# Patient Record
Sex: Male | Born: 1955 | Race: White | Hispanic: No | Marital: Married | Smoking: Former smoker
Health system: Southern US, Community
[De-identification: ages and names within clinical notes are randomized; demographics above are authoritative.]

## PROBLEM LIST (undated history)

## (undated) DIAGNOSIS — I639 Cerebral infarction, unspecified: Secondary | ICD-10-CM

## (undated) DIAGNOSIS — I499 Cardiac arrhythmia, unspecified: Secondary | ICD-10-CM

## (undated) DIAGNOSIS — I252 Old myocardial infarction: Secondary | ICD-10-CM

## (undated) DIAGNOSIS — I10 Essential (primary) hypertension: Secondary | ICD-10-CM

## (undated) DIAGNOSIS — E785 Hyperlipidemia, unspecified: Secondary | ICD-10-CM

## (undated) DIAGNOSIS — I2510 Atherosclerotic heart disease of native coronary artery without angina pectoris: Secondary | ICD-10-CM

## (undated) DIAGNOSIS — Z950 Presence of cardiac pacemaker: Secondary | ICD-10-CM

## (undated) DIAGNOSIS — I495 Sick sinus syndrome: Secondary | ICD-10-CM

## (undated) DIAGNOSIS — K921 Melena: Secondary | ICD-10-CM

## (undated) HISTORY — DX: Sick sinus syndrome: I49.5

## (undated) HISTORY — PX: INSERT / REPLACE / REMOVE PACEMAKER: SUR710

## (undated) HISTORY — DX: Cardiac arrhythmia, unspecified: I49.9

## (undated) HISTORY — DX: Essential (primary) hypertension: I10

## (undated) HISTORY — DX: Atherosclerotic heart disease of native coronary artery without angina pectoris: I25.10

## (undated) HISTORY — DX: Presence of cardiac pacemaker: Z95.0

## (undated) HISTORY — DX: Melena: K92.1

## (undated) HISTORY — DX: Old myocardial infarction: I25.2

## (undated) HISTORY — PX: CARDIAC DEFIBRILLATOR PLACEMENT: SHX171

## (undated) HISTORY — DX: Cerebral infarction, unspecified: I63.9

## (undated) HISTORY — DX: Hyperlipidemia, unspecified: E78.5

---

## 2002-02-05 DIAGNOSIS — I639 Cerebral infarction, unspecified: Secondary | ICD-10-CM

## 2002-02-05 HISTORY — DX: Cerebral infarction, unspecified: I63.9

## 2004-01-10 ENCOUNTER — Ambulatory Visit: Payer: Self-pay | Admitting: Internal Medicine

## 2004-04-18 ENCOUNTER — Ambulatory Visit: Payer: Self-pay | Admitting: Surgery

## 2013-02-05 HISTORY — PX: CARDIAC CATHETERIZATION: SHX172

## 2013-07-22 DIAGNOSIS — I252 Old myocardial infarction: Secondary | ICD-10-CM

## 2013-07-22 DIAGNOSIS — I2510 Atherosclerotic heart disease of native coronary artery without angina pectoris: Secondary | ICD-10-CM

## 2013-07-22 HISTORY — DX: Atherosclerotic heart disease of native coronary artery without angina pectoris: I25.10

## 2013-07-22 HISTORY — DX: Old myocardial infarction: I25.2

## 2013-08-18 ENCOUNTER — Encounter: Payer: Self-pay | Admitting: Cardiovascular Disease

## 2013-09-05 ENCOUNTER — Encounter: Payer: Self-pay | Admitting: Cardiovascular Disease

## 2013-09-05 DIAGNOSIS — I495 Sick sinus syndrome: Secondary | ICD-10-CM

## 2013-09-05 HISTORY — DX: Sick sinus syndrome: I49.5

## 2013-09-10 DIAGNOSIS — Z950 Presence of cardiac pacemaker: Secondary | ICD-10-CM

## 2013-09-10 HISTORY — DX: Presence of cardiac pacemaker: Z95.0

## 2013-10-06 ENCOUNTER — Encounter: Payer: Self-pay | Admitting: Cardiovascular Disease

## 2013-11-05 ENCOUNTER — Encounter: Payer: Self-pay | Admitting: Cardiovascular Disease

## 2016-02-21 ENCOUNTER — Encounter: Payer: Self-pay | Admitting: Internal Medicine

## 2016-02-21 ENCOUNTER — Ambulatory Visit (INDEPENDENT_AMBULATORY_CARE_PROVIDER_SITE_OTHER): Payer: BC Managed Care – PPO | Admitting: Internal Medicine

## 2016-02-21 VITALS — BP 134/88 | HR 70 | Ht 71.0 in | Wt 221.5 lb

## 2016-02-21 DIAGNOSIS — I4891 Unspecified atrial fibrillation: Secondary | ICD-10-CM | POA: Diagnosis not present

## 2016-02-21 DIAGNOSIS — E782 Mixed hyperlipidemia: Secondary | ICD-10-CM

## 2016-02-21 DIAGNOSIS — Z950 Presence of cardiac pacemaker: Secondary | ICD-10-CM | POA: Diagnosis not present

## 2016-02-21 DIAGNOSIS — I495 Sick sinus syndrome: Secondary | ICD-10-CM

## 2016-02-21 NOTE — Patient Instructions (Addendum)
Medication Instructions: - none ordered  Labwork: - Your physician recommends that you have lab work today: CMET/ Lipid/ TSH/ CBC  Procedures/Testing: - Your physician has requested that you have an echocardiogram. Echocardiography is a painless test that uses sound waves to create images of your heart. It provides your doctor with information about the size and shape of your heart and how well your heart's chambers and valves are working. This procedure takes approximately one hour. There are no restrictions for this procedure.   Follow-Up: - Your physician recommends that you schedule a follow-up appointment: next available with Dr. Okey DupreEnd (to establish)  - Remote monitoring is used to monitor your Pacemaker of ICD from home. This monitoring reduces the number of office visits required to check your device to one time per year. It allows us to keep an eye on the functioning of your device to ensure it is working properly. You are scheduled for a device check from home on 05/22/16. You may send your transmission at any time that day. If you have a wireless device, the transmission will be sent automatically. After your physician reviews your transmission, you will receive a postcard with your next transmission date.  - Your physician wants you to follow-up in: 1 year with Dr. Graciela HusbandsKlein. You will receive a reminder letter in the mail two months in advance. If you don't receive a letter, please call our office to schedule the follow-up appointment.  Any Additional Special Instructions Will Be Listed Below (If Applicable). - call the Device Clinic at [phone] if you cannot find your monitor box     If you need a refill on your cardiac medications before your next appointment, please call your pharmacy.

## 2016-02-21 NOTE — Progress Notes (Signed)
ELECTROPHYSIOLOGY CONSULT NOTE  Patient ID: Eduardo Ray., MRN: [id number], DOB/AGE: [birth year] 61 y.o. Admit date: (Not on file) Date of Consult: 02/21/2016  Primary Physician: No primary care provider on file. Primary Cardiologist: NEW Consulting Physician SELF  Chief Complaint: PACEMAKER TO ESTABLISH   HPI Eduardo Ray. is a 61 y.o. male seen to establish pacemaker follow-up. This was implanted for sinus node dysfunction with syncope that occurred within weeks following IMI 2015  No further syncope.  Some DOE but no chest pain, orthopnea or edema  [place] palpitations  History of coronary artery disease with STEMI 6/15 treated with stents . DATE TEST    6/17    echo   EF 50-55 % Inferior WMA  6/17    cath  RCA 100%  Aspiration/ DES          LVEF [facility] was 50-55% with inferoseptal and inferobasilar hypokinesis He is on DAPT   He has a history of stroke about 2005.  He has significant hyperlipidemia 6/15 LDL 207--  No repeats    Past Medical History:  Diagnosis Date  . Black stool   . CAD (coronary artery disease)   . Hx of ST elevation myocardial infarction   . Hyperlipidemia   . Hypertension   . Irregular heart beat   . Pacemaker-Medtronic MRI compatible   . Sinus node dysfunction (HCC)   . Stroke [facility])       Surgical History:  Past Surgical History:  Procedure Laterality Date  . CARDIAC CATHETERIZATION  2015   X1 stent [facility]   . CARDIAC DEFIBRILLATOR PLACEMENT    . INSERT / REPLACE / REMOVE PACEMAKER     Medtronic     Home Meds: Prior to Admission medications   Medication Sig Start Date End Date Taking? Authorizing Provider  acetaminophen (TYLENOL) 650 MG CR tablet Take 650 mg by mouth every 8 (eight) hours as needed for pain.   Yes Historical Provider, MD  aspirin EC 81 MG tablet Take 81 mg by mouth daily.    Yes Historical Provider, MD    Allergies:  Allergies  Allergen Reactions  . Atorvastatin     Muscle pain  . Rosuvastatin     Other  reaction(s): Other (See Comments) Muscle pains    Social History   Social History  . Marital status: Married    Spouse name: N/A  . Number of children: N/A  . Years of education: N/A   Occupational History  . Not on file.   Social History Main Topics  . Smoking status: Never Smoker  . Smokeless tobacco: Never Used  . Alcohol use Yes     Comment: social  . Drug use:     Types: Marijuana  . Sexual activity: Not on file   Other Topics Concern  . Not on file   Social History Narrative  . No narrative on file     Family History  Problem Relation Age of Onset  . Hyperlipidemia Mother   . Hypertension Mother      ROS:  Please see the history of present illness.     All other systems reviewed and negative.    Physical Exam: Blood pressure 134/88, pulse 70, height 5\' 11"  (1.803 m), weight 221 lb 8 oz (100.5 kg). General: Well developed, well nourished male in no acute distress. Head: Normocephalic, atraumatic, sclera non-icteric, no xanthomas, nares are without discharge. EENT: normal  Lymph Nodes:  none Neck: Negative for carotid  bruits. JVD not elevated. Back:without scoliosis kyphosis Lungs: Clear bilaterally to auscultation without wheezes, rales, or rhonchi. Breathing is unlabored. Heart: RRR with S1 S2. No  murmur . No rubs, or gallops appreciated. Abdomen: Soft, non-tender, non-distended with normoactive bowel sounds. No hepatomegaly. No rebound/guarding. No obvious abdominal masses. Msk:  Strength and tone appear normal for age. Extremities: No clubbing or cyanosis. No edema.  Distal pedal pulses are 2+ and equal bilaterally. Skin: Warm and Dry Neuro: Alert and oriented X 3. CN III-XII intact Grossly normal sensory and motor function . Psych:  Responds to questions appropriately with a normal affect.      Labs: Cardiac Enzymes No results for input(s): CKTOTAL, CKMB, TROPONINI in the last 72 hours. CBC No results found for: WBC, HGB, HCT, MCV,  PLT PROTIME: No results for input(s): LABPROT, INR in the last 72 hours. Chemistry No results for input(s): NA, K, CL, CO2, BUN, CREATININE, CALCIUM, PROT, BILITOT, ALKPHOS, ALT, AST, GLUCOSE in the last 168 hours.  Invalid input(s): LABALBU Lipids No results found for: CHOL, HDL, LDLCALC, TRIG BNP No results found for: PROBNP Thyroid Function Tests: No results for input(s): TSH, T4TOTAL, T3FREE, THYROIDAB in the last 72 hours.  Invalid input(s): FREET3 Miscellaneous No results found for: DDIMER  Radiology/Studies:  No results found.  EKG: Atrial paced rhythm at 70 Intervals 20/08/42 Axis L   Assessment and Plan:   Syncope  Sinus node dysfunction  Pacemaker-Medtronic  Hyperlipidemia   statin intolerance  Coronary artery disease with prior stenting/prior MI   The patient has sinus node dysfunction with appropriate pacemaker function and no interval syncope  He has coronary artery disease without symptoms. He has severe dyslipidemia with LDL greater than 200 and has been statin intolerant. We will refer him for PCKS9  therapy   For now we'll continue aspirin.   We will check an echocardiogram to see about LV function      [name]

## 2016-02-22 LAB — COMPREHENSIVE METABOLIC PANEL
ALT: 12 IU/L (ref 0–44)
AST: 12 IU/L (ref 0–40)
Albumin/Globulin Ratio: 1.7 (ref 1.2–2.2)
Albumin: 4.4 g/dL (ref 3.6–4.8)
Alkaline Phosphatase: 74 IU/L (ref 39–117)
BUN/Creatinine Ratio: 10 (ref 10–24)
BUN: 11 mg/dL (ref 8–27)
Bilirubin Total: 0.3 mg/dL (ref 0.0–1.2)
CALCIUM: 9.5 mg/dL (ref 8.6–10.2)
CO2: 21 mmol/L (ref 18–29)
CREATININE: 1.08 mg/dL (ref 0.76–1.27)
Chloride: 101 mmol/L (ref 96–106)
GFR calc Af Amer: 85 mL/min/{1.73_m2} (ref >59)
GFR, EST NON AFRICAN AMERICAN: 73 mL/min/{1.73_m2} (ref >59)
GLOBULIN, TOTAL: 2.6 g/dL (ref 1.5–4.5)
GLUCOSE: 98 mg/dL (ref 65–99)
Potassium: 4.6 mmol/L (ref 3.5–5.2)
SODIUM: 139 mmol/L (ref 134–144)
Total Protein: 7 g/dL (ref 6.0–8.5)

## 2016-02-22 LAB — TSH: TSH: 1.41 u[IU]/mL (ref 0.450–4.500)

## 2016-02-22 LAB — CBC WITH DIFFERENTIAL/PLATELET
BASOS ABS: 0.1 10*3/uL (ref 0.0–0.2)
Basos: 1 %
EOS (ABSOLUTE): 0.3 10*3/uL (ref 0.0–0.4)
EOS: 4 %
HEMATOCRIT: 43 % (ref 37.5–51.0)
HEMOGLOBIN: 15 g/dL (ref 13.0–17.7)
IMMATURE GRANS (ABS): 0 10*3/uL (ref 0.0–0.1)
IMMATURE GRANULOCYTES: 1 %
LYMPHS: 45 %
Lymphocytes Absolute: 2.8 10*3/uL (ref 0.7–3.1)
MCH: 31.1 pg (ref 26.6–33.0)
MCHC: 34.9 g/dL (ref 31.5–35.7)
MCV: 89 fL (ref 79–97)
MONOCYTES: 9 %
Monocytes Absolute: 0.5 10*3/uL (ref 0.1–0.9)
NEUTROS PCT: 40 %
Neutrophils Absolute: 2.4 10*3/uL (ref 1.4–7.0)
Platelets: 334 10*3/uL (ref 150–379)
RBC: 4.82 x10E6/uL (ref 4.14–5.80)
RDW: 14.2 % (ref 12.3–15.4)
WBC: 6.1 10*3/uL (ref 3.4–10.8)

## 2016-02-22 LAB — LIPID PANEL
CHOLESTEROL TOTAL: 259 mg/dL — AB (ref 100–199)
Chol/HDL Ratio: 7.8 ratio units — ABNORMAL HIGH (ref 0.0–5.0)
HDL: 33 mg/dL — ABNORMAL LOW (ref >39)
LDL CALC: 173 mg/dL — AB (ref 0–99)
Triglycerides: 267 mg/dL — ABNORMAL HIGH (ref 0–149)
VLDL Cholesterol Cal: 53 mg/dL — ABNORMAL HIGH (ref 5–40)

## 2016-03-12 ENCOUNTER — Other Ambulatory Visit: Payer: Self-pay | Admitting: Internal Medicine

## 2016-03-15 ENCOUNTER — Ambulatory Visit (INDEPENDENT_AMBULATORY_CARE_PROVIDER_SITE_OTHER): Payer: BC Managed Care – PPO

## 2016-03-15 ENCOUNTER — Other Ambulatory Visit: Payer: Self-pay

## 2016-03-15 DIAGNOSIS — I495 Sick sinus syndrome: Secondary | ICD-10-CM | POA: Diagnosis not present

## 2016-03-15 DIAGNOSIS — I4891 Unspecified atrial fibrillation: Secondary | ICD-10-CM

## 2016-03-15 HISTORY — PX: TRANSTHORACIC ECHOCARDIOGRAM: SHX275

## 2016-03-20 ENCOUNTER — Ambulatory Visit: Payer: BC Managed Care – PPO | Admitting: Internal Medicine

## 2016-03-21 ENCOUNTER — Encounter: Payer: Self-pay | Admitting: Internal Medicine

## 2016-03-21 ENCOUNTER — Ambulatory Visit (INDEPENDENT_AMBULATORY_CARE_PROVIDER_SITE_OTHER): Payer: BC Managed Care – PPO | Admitting: Internal Medicine

## 2016-03-21 VITALS — BP 134/76 | HR 64 | Ht 71.0 in | Wt 227.5 lb

## 2016-03-21 DIAGNOSIS — Z950 Presence of cardiac pacemaker: Secondary | ICD-10-CM | POA: Insufficient documentation

## 2016-03-21 DIAGNOSIS — I2510 Atherosclerotic heart disease of native coronary artery without angina pectoris: Secondary | ICD-10-CM | POA: Diagnosis not present

## 2016-03-21 DIAGNOSIS — I10 Essential (primary) hypertension: Secondary | ICD-10-CM | POA: Insufficient documentation

## 2016-03-21 DIAGNOSIS — I495 Sick sinus syndrome: Secondary | ICD-10-CM | POA: Insufficient documentation

## 2016-03-21 DIAGNOSIS — I252 Old myocardial infarction: Secondary | ICD-10-CM | POA: Insufficient documentation

## 2016-03-21 DIAGNOSIS — E782 Mixed hyperlipidemia: Secondary | ICD-10-CM | POA: Diagnosis not present

## 2016-03-21 MED ORDER — EZETIMIBE 10 MG PO TABS: 10.0000 mg | ORAL_TABLET | Freq: Every day | ORAL | 3 refills | Status: DC

## 2016-03-21 MED ORDER — CARVEDILOL 3.125 MG PO TABS: 3.1250 mg | ORAL_TABLET | Freq: Two times a day (BID) | ORAL | 3 refills | Status: DC

## 2016-03-21 NOTE — Patient Instructions (Signed)
Medication Instructions:  Your physician has recommended you make the following change in your medication:  1- START taking Carvedilol 3.125 mg (1 tablet) by mouth two times a day. 2- START taking Zetia 10 mg (1 tablet) by mouth once a day.   Labwork: none  Testing/Procedures: none  Follow-Up: You have been referred to THE [facility]IP[facility] IN [place] [facility] FOR EVALUATION.   Your physician wants you to follow-up in: 6 MONTHS WITH DR END. You will receive a reminder letter in the mail two months in advance. If you don't receive a letter, please call our office to schedule the follow-up appointment.   If you need a refill on your cardiac medications before your next appointment, please call your pharmacy.

## 2016-03-21 NOTE — Progress Notes (Signed)
New Outpatient Visit Date: 03/21/2016  Referring Provider: Duke SalviaSteven C Klein, MD [address] [place], [place] [address]  Chief Complaint: Establish cardiology care  HPI:  [name] is a 61 y.o. year-old male with history of coronary artery disease status post inferior MI in 2015, sick sinus syndrome status post dual-chamber pacemaker, hyperlipidemia, and stroke who has been referred by Dr. Graciela HusbandsKlein for management of coronary artery disease and hyperlipidemia. [facility] after his STEMI in 2015. However, [place] year and has since moved back to the area. He has been feeling well for the last 2 years, denying chest pain, shortness of breath, palpitations, lightheadedness, orthopnea, PND, edema, and claudication. He has not had any issues with his pacemaker.  In regard to his MI, he developed acute onset of chest pain on 07/22/13 while working in his garage. He waited several hours for the pain to resolve, which it never did. He ultimately contacted EMS and was [facility] [place] [facility]. He was found to have thrombotic occlusion of the mid RCA, which was treated with aspiration thrombectomy and drug-eluting stent placement (see details below). A few weeks after leaving the hospital, the patient had an episode of diaphoresis and weakness and ultimate syncope. He was wearing an event monitor at the time which subsequently revealed marked bracycadia (including sinus arrest with 11 second pause). He [facility]. [name] was treated with dual antiplatelet therapy with aspirin and clopidogrel for a year following his MI. He has also been treated with multiple statins in the past but has been intolerant of them due to significant myalgias. He feels that even after discontinuation, his muscle strength never returns all the way to baseline. He is  hesitant to try another statin.  [name] was recently seen in electrophysiology clinic by Dr. Graciela HusbandsKlein. Routine labs, including lipid panel, demonstrated significant hyperlipidemia with total cholesterol of 259, HDL 33, LDL 173, and triglycerides 267.  --------------------------------------------------------------------------------------------------  Cardiovascular History & Procedures: Cardiovascular Problems:  Coronary artery disease status post inferior MI in 2015  Sick sinus syndrome status post dual-chamber pacemaker in 2015  Risk Factors:  Known coronary artery disease, hypertension, hyperlipidemia, male gender, and age greater than 2055  Cath/PCI:  LHC/PCI (07/22/13, [facility]): LMCA normal. LAD with 20% mid vessel stenosis. LCx without significant disease. Large RCA with 100% thrombotic occlusion. 20% PDA stenosis also present. LVEDP 3 mmHg. Successful aspiration thrombectomy and OCT-guided PCI to mid RCA with placement of a Xience Alpine 4.0 x 38 mm drug-eluting stent.  CV Surgery:  None  EP Procedures and Devices:  Medtronic dual-chamber pacemaker (09/10/13, [facility])  Non-Invasive Evaluation(s):  TTE (03/15/16): Mildly dilated LV with mild LVH. LVEF low normal with hypokinesis of the inferior wall (LVEF 50-55%). Mild left atrial enlargement. Normal RV size and function. No significant valvular abnormalities.  TTE (07/23/13, [facility]): Normal LV size and wall thickness with inferior and inferoseptal hypokinesis. LVEF 50-55%. RV size and function. No significant valvular abnormalities.  Recent CV Pertinent Labs: Lab Results  Component Value Date   CHOL 259 (H) 02/21/2016   HDL 33 (L) 02/21/2016   LDLCALC 173 (H) 02/21/2016   TRIG 267 (H) 02/21/2016   CHOLHDL 7.8 (H) 02/21/2016   K 4.6 02/21/2016   BUN 11 02/21/2016   CREATININE 1.08 02/21/2016    --------------------------------------------------------------------------------------------------  Past Medical History:  Diagnosis  Date  . Black stool   .  CAD (coronary artery disease)   . Hx of ST elevation myocardial infarction   . Hyperlipidemia   . Hypertension   . Irregular heart beat   . Pacemaker-Medtronic MRI compatible   . Sinus node dysfunction (HCC)   . Stroke [facility])     Past Surgical History:  Procedure Laterality Date  . CARDIAC CATHETERIZATION  2015   X1 stent [facility]   . CARDIAC DEFIBRILLATOR PLACEMENT    . INSERT / REPLACE / REMOVE PACEMAKER     Medtronic    Outpatient Encounter Prescriptions as of 03/21/2016  Medication Sig  . acetaminophen (TYLENOL) 650 MG CR tablet Take 650 mg by mouth every 8 (eight) hours as needed for pain.  Marland Kitchen aspirin EC 81 MG tablet Take 81 mg by mouth daily.    No facility-administered encounter medications on file as of 03/21/2016.     Allergies: Atorvastatin and Rosuvastatin  Social History   Social History  . Marital status: Married    Spouse name: N/A  . Number of children: N/A  . Years of education: N/A   Occupational History  . Not on file.   Social History Main Topics  . Smoking status: Former Smoker    Packs/day: 0.50    Years: 15.00    Types: Cigarettes    Quit date: 71  . Smokeless tobacco: Never Used  . Alcohol use Yes     Comment: social  . Drug use: Yes    Types: Marijuana  . Sexual activity: Not on file   Other Topics Concern  . Not on file   Social History Narrative  . No narrative on file    Family History  Problem Relation Age of Onset  . Hyperlipidemia Mother   . Hypertension Mother     Review of Systems: A 12-system review of systems was performed and was negative except as noted in the HPI.  --------------------------------------------------------------------------------------------------  Physical Exam: BP 134/76 (BP Location: Right Arm, Patient Position: Sitting, Cuff Size: Normal)   Pulse 64   Ht 5\' 11"  (1.803 m)   Wt 227 lb 8 oz (103.2 kg)   BMI 31.73 kg/m   General:  Obese man, seated comfortably in the  exam room. HEENT: No conjunctival pallor or scleral icterus.  Moist mucous membranes.  OP clear. Neck: Supple without lymphadenopathy, thyromegaly, JVD, or HJR.  No carotid bruit. Lungs: Normal work of breathing.  Clear to auscultation bilaterally without wheezes or crackles. Heart: Regular rate and rhythm without murmurs, rubs, or gallops.  Non-displaced PMI. Abd: Bowel sounds present.  Soft, NT/ND without hepatosplenomegaly Ext: No lower extremity edema.  Radial, PT, and DP pulses are 2+ bilaterally Skin: warm and dry without rash Neuro: CNIII-XII intact.  Strength and fine-touch sensation intact in upper and lower extremities bilaterally. Psych: Normal mood and affect.  EKG:  Atrial paced rhythm. Artifact noted in the precordial leads without significant abnormalities. No significant change from prior tracing on 02/21/16 (I have personally reviewed both tracings).  Lab Results  Component Value Date   WBC 6.1 02/21/2016   HCT 43.0 02/21/2016   MCV 89 02/21/2016   PLT 334 02/21/2016    Lab Results  Component Value Date   NA 139 02/21/2016   K 4.6 02/21/2016   CL 101 02/21/2016   CO2 21 02/21/2016   BUN 11 02/21/2016   CREATININE 1.08 02/21/2016   GLUCOSE 98 02/21/2016   ALT 12 02/21/2016    Lab Results  Component Value Date   CHOL 259 (H)  02/21/2016   HDL 33 (L) 02/21/2016   LDLCALC 173 (H) 02/21/2016   TRIG 267 (H) 02/21/2016   CHOLHDL 7.8 (H) 02/21/2016    --------------------------------------------------------------------------------------------------  ASSESSMENT AND PLAN: Coronary artery disease status post inferior STEMI in 2015 without angina Patient has recovered well from his inferior MI. Echocardiogram at the time of MI as well as recent repeat demonstrate inferior wall motion abnormality with low normal LVEF. Patient does not have any symptoms to suggest worsening coronary insufficiency. We will continue with low-dose aspirin. We will also add carvedilol 3.125  mg twice a day. We discussed lipid therapy and we will initiate ezetimibe 10 mg daily (see details below). Patient was advised to contact us if he has any new chest pain or shortness of breath.  Hyperlipidemia Recent lipid panel demonstrates significant mixed hyperlipidemia, including LDL well above goal (goal less than 70). He has tried atorvastatin, rosuvastatin, and at least one other statin agent in the past, all of which have caused significant myalgias. He is hesitant to try another agent. We have agreed to start ezetimibe 10 mg daily, though I doubt this will allow him to achieve his target. We will refer him to the lipid clinic for further management, including consideration of PCSK9 therapy.  Hypertension Blood pressure is borderline elevated today. As above, we will initiate carvedilol 3.125 mg twice a day.  Sick sinus syndrome status post pacemaker No syncopal episodes since pacemaker placement. EKG today demonstrates atrially paced rhythm. Patient should continue routine pacemaker follow-up with Dr. Graciela Husbands.  Follow-up: Return to clinic in 6 months.  Patient was encouraged to reestablish care with a PCP in the area.  Yvonne Kendall, MD 03/21/2016 10:09 AM

## 2016-04-03 ENCOUNTER — Ambulatory Visit: Payer: BC Managed Care – PPO

## 2016-04-20 LAB — CUP PACEART INCLINIC DEVICE CHECK
Battery Voltage: 3.01 V
Brady Statistic AP VP Percent: 0.04 %
Brady Statistic AS VP Percent: 0.01 %
Brady Statistic RA Percent Paced: 55.44 %
Brady Statistic RV Percent Paced: 0.05 %
Date Time Interrogation Session: 20180116162929
Implantable Lead Implant Date: 20150806
Implantable Lead Implant Date: 20150806
Implantable Lead Location: 753860
Implantable Lead Model: 5076
Lead Channel Impedance Value: 399 Ohm
Lead Channel Impedance Value: 418 Ohm
Lead Channel Impedance Value: 494 Ohm
Lead Channel Pacing Threshold Amplitude: 0.5 V
Lead Channel Pacing Threshold Pulse Width: 0.4 ms
Lead Channel Sensing Intrinsic Amplitude: 13.625 mV
Lead Channel Setting Pacing Amplitude: 1.5 V
Lead Channel Setting Sensing Sensitivity: 0.9 mV
MDC IDC LEAD LOCATION: 753859
MDC IDC MSMT BATTERY REMAINING LONGEVITY: 91 mo
MDC IDC MSMT LEADCHNL RA IMPEDANCE VALUE: 342 Ohm
MDC IDC MSMT LEADCHNL RA SENSING INTR AMPL: 4.25 mV
MDC IDC MSMT LEADCHNL RV PACING THRESHOLD AMPLITUDE: 1 V
MDC IDC MSMT LEADCHNL RV PACING THRESHOLD PULSEWIDTH: 0.4 ms
MDC IDC PG IMPLANT DT: 20150806
MDC IDC SET LEADCHNL RV PACING AMPLITUDE: 2 V
MDC IDC SET LEADCHNL RV PACING PULSEWIDTH: 0.4 ms
MDC IDC STAT BRADY AP VS PERCENT: 56.06 %
MDC IDC STAT BRADY AS VS PERCENT: 43.89 %

## 2016-05-21 ENCOUNTER — Telehealth: Payer: Self-pay | Admitting: *Deleted

## 2016-05-21 NOTE — Telephone Encounter (Signed)
Attempted to reach patient to determine if he ever found his [employer] monitor for his PPM remote transmissions.  No VM set up.  Patient is scheduled for remote transmission tomorrow, 05/22/16.  Will plan to order new monitor and reschedule remote appointment if patient was unable to find his monitor.

## 2016-05-22 ENCOUNTER — Encounter: Payer: BC Managed Care – PPO | Admitting: *Deleted

## 2016-05-22 NOTE — Telephone Encounter (Signed)
Spoke w/ pt and he stated that he did find his home monitor. He is not sure how to use it. He is going to call back to get help w/ sending the transmission later today.

## 2016-05-23 ENCOUNTER — Encounter: Payer: Self-pay | Admitting: *Deleted

## 2016-05-23 ENCOUNTER — Ambulatory Visit (INDEPENDENT_AMBULATORY_CARE_PROVIDER_SITE_OTHER): Payer: BC Managed Care – PPO

## 2016-05-23 ENCOUNTER — Ambulatory Visit
Admission: EM | Admit: 2016-05-23 | Discharge: 2016-05-23 | Disposition: A | Discharge status: Home / Self Care | Payer: BC Managed Care – PPO | Attending: Family Medicine | Admitting: Family Medicine

## 2016-05-23 DIAGNOSIS — M25532 Pain in left wrist: Secondary | ICD-10-CM

## 2016-05-23 DIAGNOSIS — H6691 Otitis media, unspecified, right ear: Secondary | ICD-10-CM | POA: Diagnosis not present

## 2016-05-23 DIAGNOSIS — H6120 Impacted cerumen, unspecified ear: Secondary | ICD-10-CM | POA: Diagnosis not present

## 2016-05-23 DIAGNOSIS — H6123 Impacted cerumen, bilateral: Secondary | ICD-10-CM

## 2016-05-23 MED ORDER — AMOXICILLIN 875 MG PO TABS: 875.0000 mg | ORAL_TABLET | Freq: Two times a day (BID) | ORAL | 0 refills | Status: DC

## 2016-05-23 NOTE — Discharge Instructions (Signed)
Take medication as prescribed. Rest.  Wear splint as needed for pain.   Follow up with orthopedic in one week as discussed. See above.   Follow up with your primary care physician as discussed. Return to Urgent care for new or worsening concerns.

## 2016-05-23 NOTE — ED Provider Notes (Signed)
[facility] ____________________________________________  Time seen: Approximately 1:54 PM  I have reviewed the triage vital signs and the nursing notes.   HISTORY  Chief Complaint Ear Fullness  HPI Eduardo Ray. is a 61 y.o. male present for evaluation of bilateral ear muffled sensation as well as left wrist pain. Patient reports he has a history of cerumen impaction and states the last approximately 2 weeks both his ears have felt clogged. Patient reports right worse than left. Patient reports some occasional ringing sensation in his right ear. Denies any trauma or injury. Reports does have some occasional runny nose and nasal congestion consistent with seasonal allergies. Denies any persistent nasal congestion, sinus pressure, sore throat or fevers.  Patient also reports his left wrist pain. Patient reports approximate 6 month ago he was working on a car and dropped a block on his left wrist with his palm up causing pain. Patient reports he has never been seen for this and never had x-rays completed. Patient reports pain is worse when he does repetitive movements. Patient states at times pain is severe but improves with rest and ice. Reports pain is not always present. Denies any decreased range of motion, paresthesias, pain radiation, redness, or break in skin or other complaints. Reports has been taking intermittent over-the-counter Tylenol with minimal improvement.  Denies chest pain, shortness of breath, abdominal pain, dysuria, extremity pain, extremity swelling or rash. Denies recent sickness. Denies recent antibiotic use.    Past Medical History:  Diagnosis Date  . Black stool   . CAD (coronary artery disease)   . Hx of ST elevation myocardial infarction   . Hyperlipidemia   . Hypertension   . Irregular heart beat   . Pacemaker-Medtronic MRI compatible   . Sinus node dysfunction (HCC)   . Stroke [facility]) 2004   no residual deficits    Patient Active Problem  List   Diagnosis Date Noted  . Coronary artery disease involving native coronary artery of native heart without angina pectoris 03/21/2016  . History of ST elevation myocardial infarction (STEMI) 03/21/2016  . Mixed hyperlipidemia 03/21/2016  . Essential hypertension 03/21/2016  . Sick sinus syndrome (HCC) 03/21/2016  . History of pacemaker 03/21/2016    Past Surgical History:  Procedure Laterality Date  . CARDIAC CATHETERIZATION  2015   DES to RCA in setting of STEMI  . CARDIAC DEFIBRILLATOR PLACEMENT    . INSERT / REPLACE / REMOVE PACEMAKER     Medtronic     No current facility-administered medications for this encounter.   Current Outpatient Prescriptions:  .  acetaminophen (TYLENOL) 650 MG CR tablet, Take 650 mg by mouth every 8 (eight) hours as needed for pain., Disp: , Rfl:  .  aspirin EC 81 MG tablet, Take 81 mg by mouth daily. , Disp: , Rfl:  .  amoxicillin (AMOXIL) 875 MG tablet, Take 1 tablet (875 mg total) by mouth 2 (two) times daily., Disp: 20 tablet, Rfl: 0 .  carvedilol (COREG) 3.125 MG tablet, Take 1 tablet (3.125 mg total) by mouth 2 (two) times daily with a meal., Disp: 180 tablet, Rfl: 3 .  ezetimibe (ZETIA) 10 MG tablet, Take 1 tablet (10 mg total) by mouth daily., Disp: 90 tablet, Rfl: 3  Allergies Atorvastatin and Rosuvastatin  Family History  Problem Relation Age of Onset  . Hyperlipidemia Mother   . Hypertension Mother     Social History Social History  Substance Use Topics  . Smoking status: Former Smoker  Packs/day: 0.50    Years: 15.00    Types: Cigarettes    Quit date: 75  . Smokeless tobacco: Never Used  . Alcohol use Yes     Comment: social    Review of Systems Constitutional: No fever/chills ENT: No sore throat.As above. Cardiovascular: Denies chest pain. Respiratory: Denies shortness of breath. Musculoskeletal:As above. Skin: Negative for rash.  ____________________________________________   PHYSICAL EXAM:  VITAL  SIGNS: ED Triage Vitals  Enc Vitals Group     BP 05/23/16 1150 (!) 181/105     Pulse Rate 05/23/16 1150 64     Resp 05/23/16 1150 16     Temp 05/23/16 1150 98.2 F (36.8 C)     Temp Source 05/23/16 1150 Oral     SpO2 05/23/16 1150 99 %     Weight 05/23/16 1153 225 lb (102.1 kg)     Height 05/23/16 1153  (1.803 m)     Head Circumference --      Peak Flow --      Pain Score --      Pain Loc --      Pain Edu? --      Excl. in GC? --    Vitals:   05/23/16 1150 05/23/16 1153 05/23/16 1444  BP: (!) 181/105  (!) 157/92  Pulse: 64    Resp: 16    Temp: 98.2 F (36.8 C)    TempSrc: Oral    SpO2: 99%    Weight:  225 lb (102.1 kg)   Height:   (1.803 m)     Constitutional: Alert and oriented. Well appearing and in no acute distress. Eyes: Conjunctivae are normal. PERRL. EOMI. ENT      Head: Normocephalic and atraumatic.      Ears: Bilateral ears total cerumen impaction. No surrounding tenderness, swelling or erythema bilaterally. Post cerumen removal reevaluated. Right: Full TM not visible From her remaining cerumen present, ear canal clear, TM appears mildly erythematous, no visible TM rupture and drainage. Left: Mild cerumen present, TM appears nonerythematous and intact. No surrounding tenderness, swelling or erythema bilaterally.      Nose: No congestion/rhinnorhea.      Mouth/Throat: Mucous membranes are moist.Oropharynx non-erythematous. Neck: No stridor. Supple without meningismus.  Hematological/Lymphatic/Immunilogical: No cervical lymphadenopathy. Cardiovascular: Normal rate, regular rhythm. Grossly normal heart sounds.  Good peripheral circulation. Respiratory: Normal respiratory effort without tachypnea nor retractions. Breath sounds are clear and equal bilaterally. No wheezes, rales, rhonchi. Musculoskeletal:  Ambulatory with steady gait. Left wrist with mild to moderate tenderness to direct palpation at distal radius and left snuffbox, left hand and wrist  otherwise nontender, full range of motion present to left hand and wrist, normal distal sensation to all left fingers, no erythema, no ecchymosis, slight edema noted at distal radius. Bilateral radial pulses equal and easily palpated. Neurologic:  Normal speech and language.  Speech is normal. No gait instability.  Skin:  Skin is warm, dry and intact. No rash noted. Psychiatric: Mood and affect are normal. Speech and behavior are normal. Patient exhibits appropriate insight and judgment   ___________________________________________   LABS (all labs ordered are listed, but only abnormal results are displayed)  Labs Reviewed - No data to display  RADIOLOGY  Dg Wrist Complete Left  Result Date: 05/23/2016 CLINICAL DATA:  Wrist pain, history of prior trauma a year ago EXAM: LEFT WRIST - COMPLETE 3+ VIEW COMPARISON:  None. FINDINGS: Degenerative changes of the first [facility] joint are noted. Degenerative changes in the radiocarpal  articulation are noted as well. Some well corticated bony densities are noted about the radiocarpal joint likely related to prior trauma. No acute fracture or dislocation is seen. IMPRESSION: Chronic changes without acute abnormality. Electronically Signed   By: Alcide Clever M.D.   On: 05/23/2016 14:18   ____________________________________________   PROCEDURES Procedures   Ceruminosis is noted.  Wax is removed by irrigation by RN. Instructions for home care to prevent wax buildup are given.   INITIAL IMPRESSION / ASSESSMENT AND PLAN / ED COURSE  Pertinent labs & imaging results that were available during my care of the patient were reviewed by me and considered in my medical decision making (see chart for details).  Well-appearing patient. No acute distress. Bilateral cerumen impaction, removed with irrigation and curette. Patient tolerated well and reports improved hearing and sensation post. Patient also with left wrist injury 6 months ago without previous  evaluation. Will evaluate left wrist x-ray.  Bilateral ears irrigated and cerumen removed by RN. Patient tolerated well but then reports area was very tender. Patient declined any further cerumen removal from bilateral ears. Unable to fully visualize bilateral TM however right TM appears erythematous and concern for right otitis media. Will treat patient with oral amoxicillin and encouraged close primary care follow-up and monitoring. Also right wrist x-ray evaluated and per radiologist chronic changes without acute abnormality, some well corticated bone densities are noted about the radiocarpal joint likely reflected to prior trauma. Patient reports this injury occurred 6 months ago, denies previous injury to area, and suspect as patient reporting tenderness and location of injury is consistent with the suspected prior trauma on x-ray, suspect remote fracture to distal radius. Velcro cock-up splint applied and encouraged patient to follow-up with orthopedics including supportive measures. Counseled regarding continue home Tylenol arthritis as needed, as he is instructed not to take ibuprofen from his cardiologist. Information for local orthopedic given. Patient declines need for prescription pain medication.  Discussed follow up with Primary care physician this week. Discussed follow up and return parameters including no resolution or any worsening concerns. Patient verbalized understanding and agreed to plan.   ____________________________________________   FINAL CLINICAL IMPRESSION(S) / ED DIAGNOSES  Final diagnoses:  Right otitis media, unspecified otitis media type  Bilateral impacted cerumen  Left wrist pain     Discharge Medication List as of 05/23/2016  2:39 PM    START taking these medications   Details  amoxicillin (AMOXIL) 875 MG tablet Take 1 tablet (875 mg total) by mouth 2 (two) times daily., Starting Wed 05/23/2016, Normal        Note: This dictation was prepared with Dragon  dictation along with smaller phrase technology. Any transcriptional errors that result from this process are unintentional.         Renford Dills, NP 05/23/16 2148

## 2016-05-23 NOTE — ED Triage Notes (Signed)
Also c/o left wrist pain, and deformity. Injured last summer when a heavy object fell on it. Now having chronic pain.

## 2016-05-23 NOTE — ED Triage Notes (Signed)
Bilat ear fullness/ pressure. States hx of cerumen impactions previously.

## 2016-05-24 NOTE — Telephone Encounter (Signed)
Spoke w/ pt and he provided me with the serial number and model number. Input this information into carelink. Attempted to help pt send remote transmission. He got error code 16. Put stated that he has perfect cell signal on his phone, the monitor is right at a window, he has no land line phone or no wifi router. Instructed pt to call tech support for further help w/ trouble shooting his monitor. Pt verbalized understanding.

## 2016-05-24 NOTE — Telephone Encounter (Signed)
2nd attempt. No answer and unable to leave a message.

## 2016-05-25 ENCOUNTER — Encounter: Payer: Self-pay | Admitting: Cardiology

## 2017-05-20 IMAGING — CR DG WRIST COMPLETE 3+V*L*
4 series · 4 of 4 positions shown · non-contrast
Comparison: None.

CLINICAL DATA: Wrist pain, history of prior trauma a year ago

EXAM:
LEFT WRIST - COMPLETE 3+ VIEW

[wrist pa]
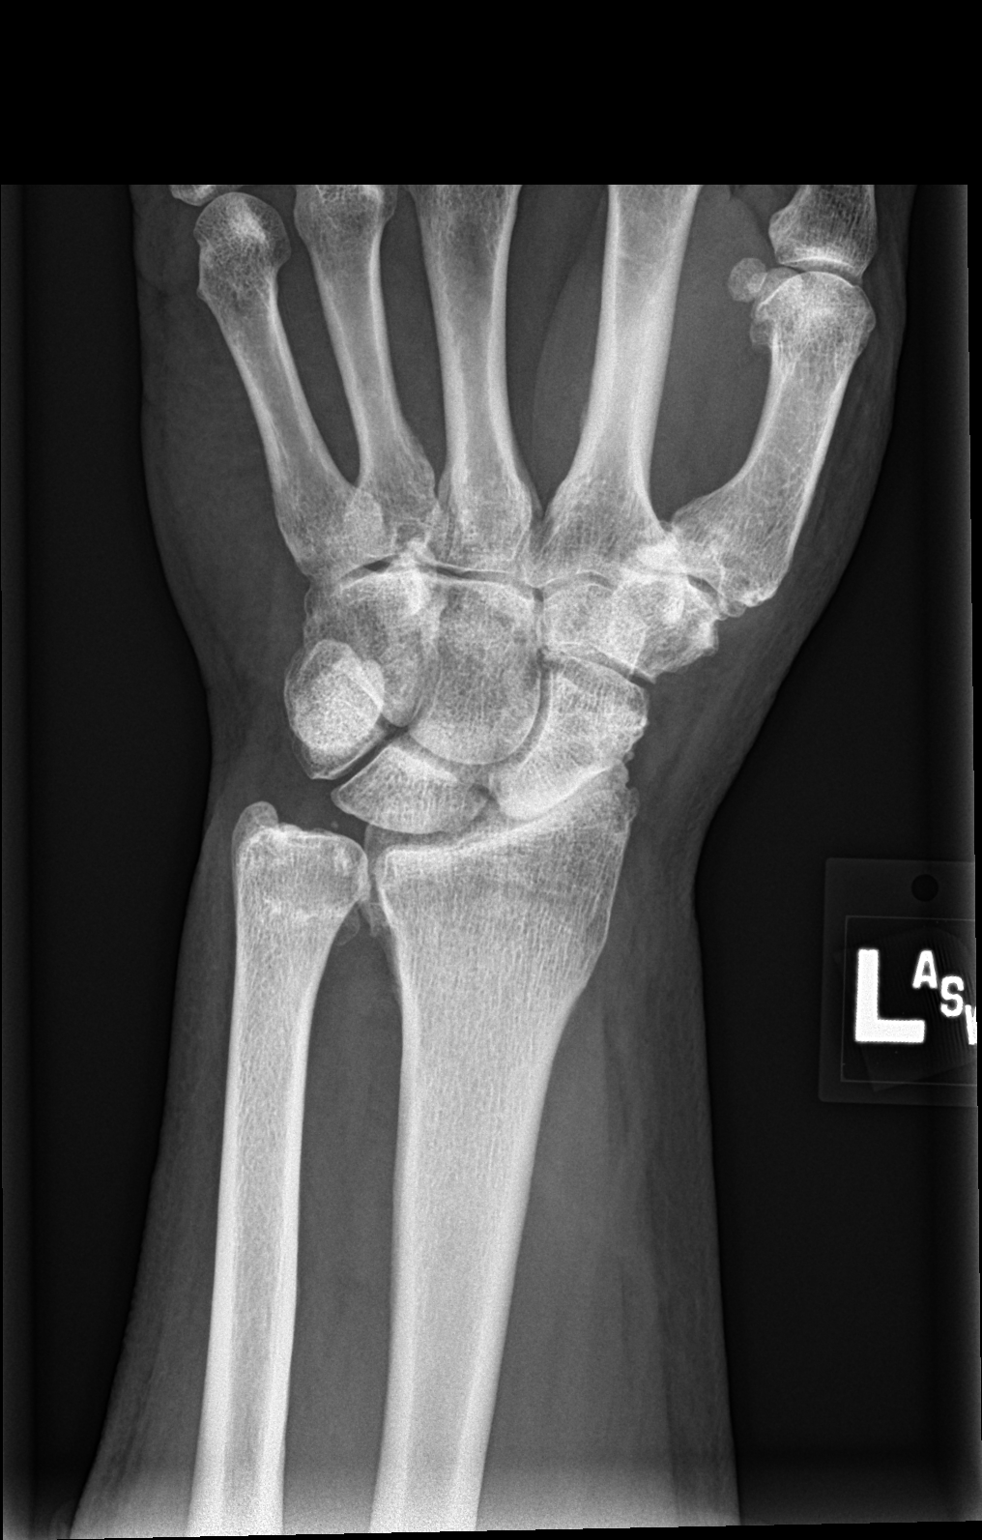

[wrist obl]
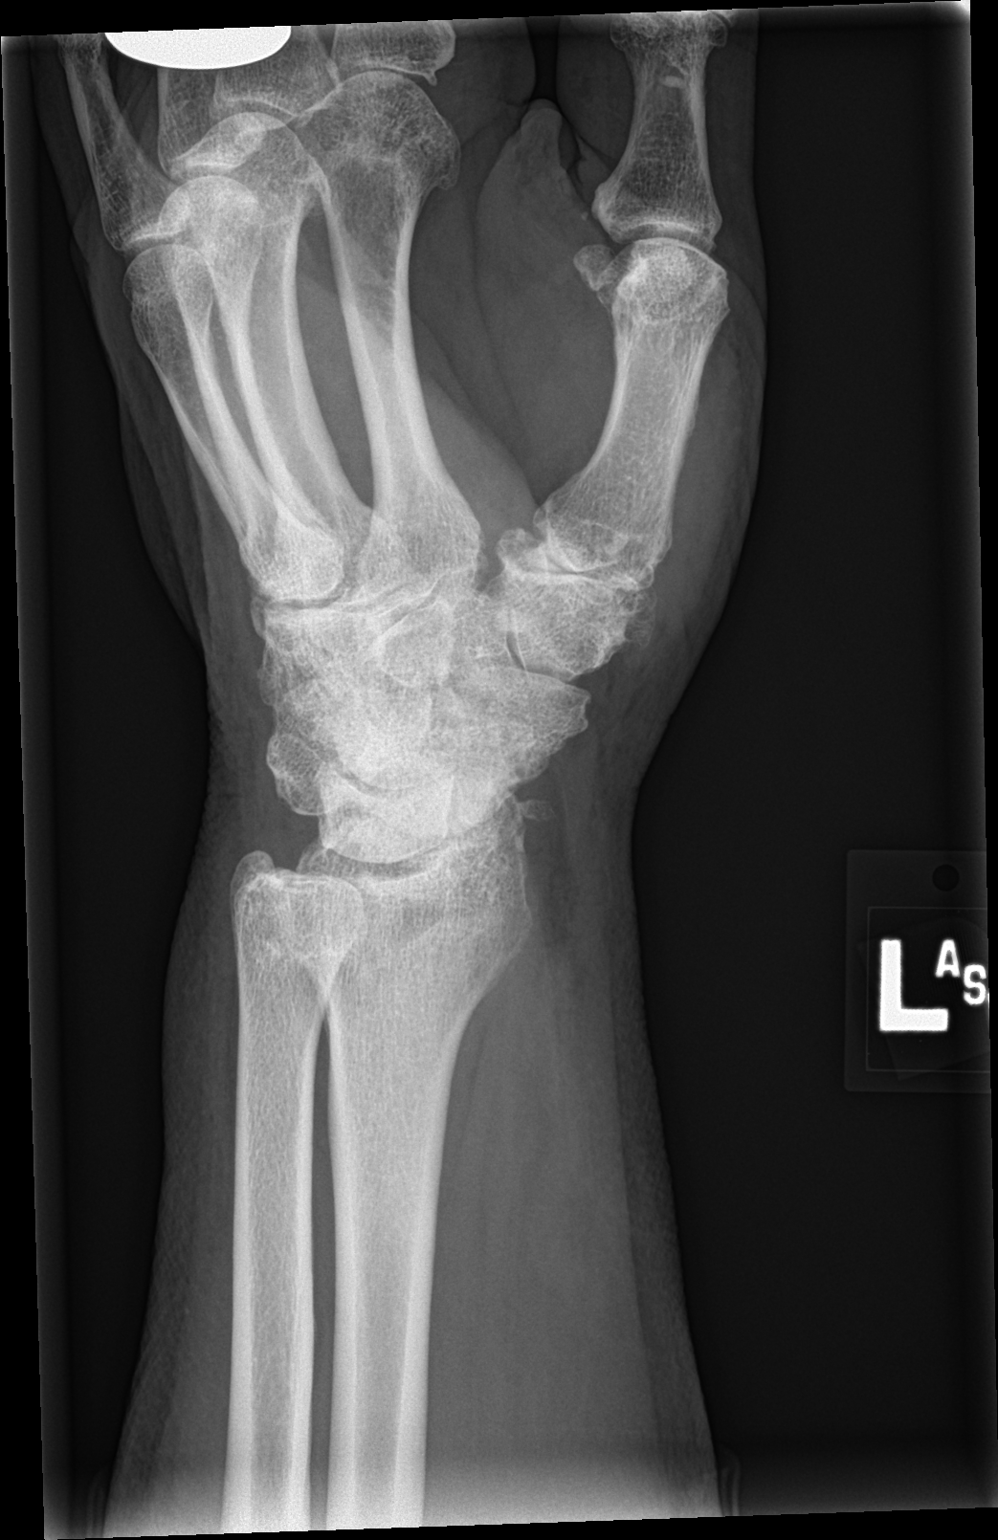

[wrist lat]
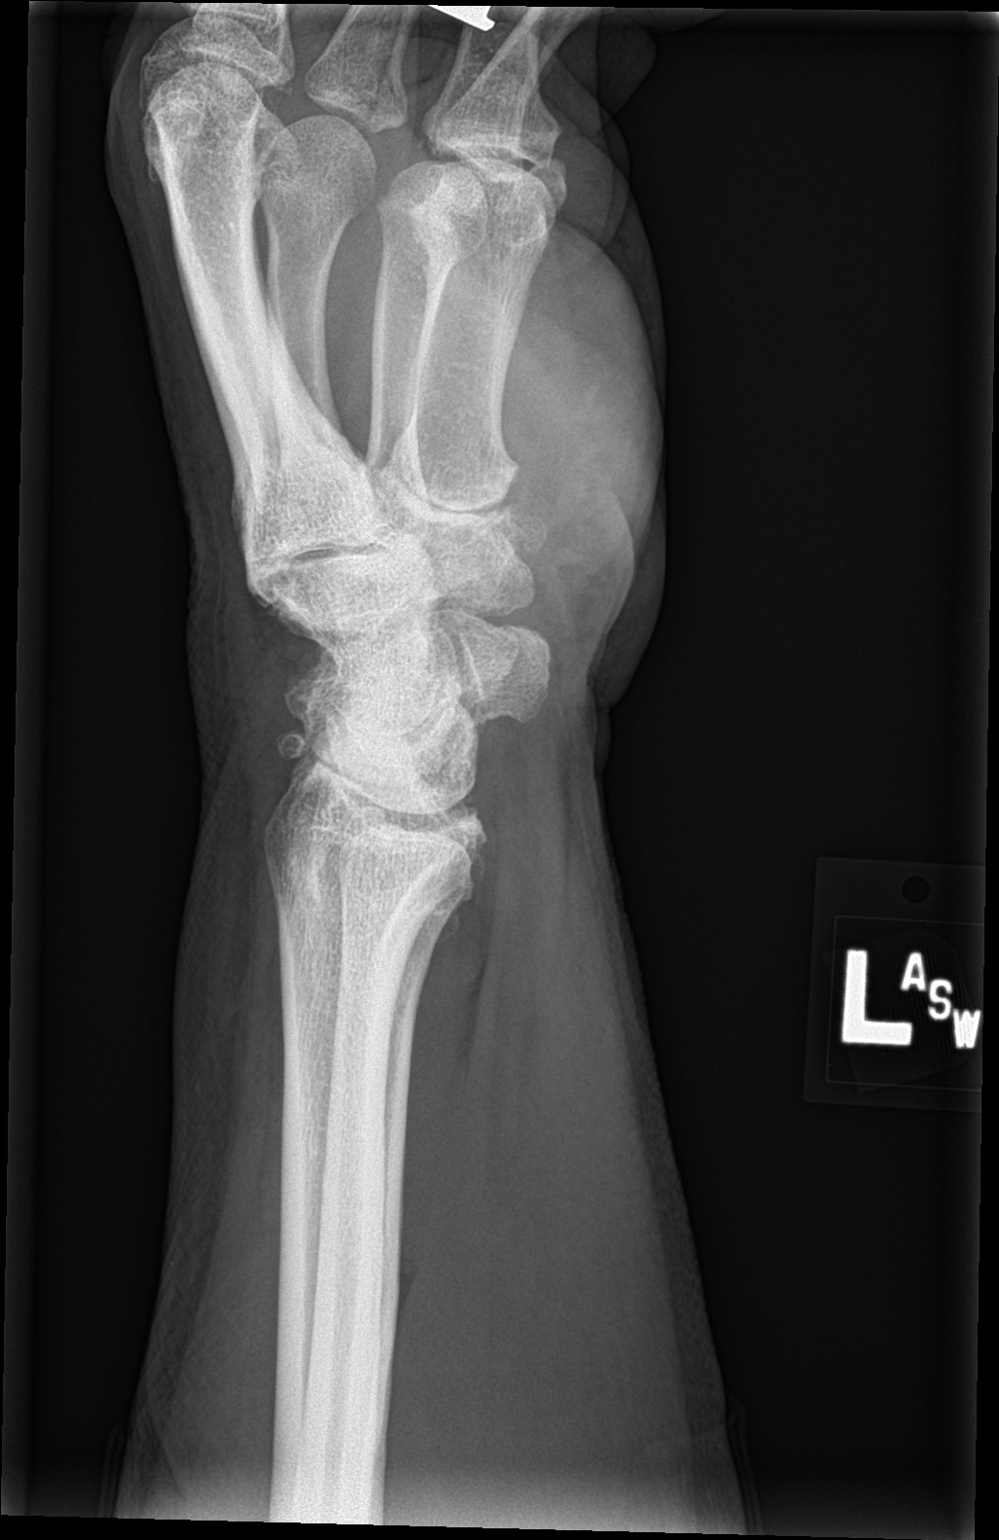

[wrist navicular]
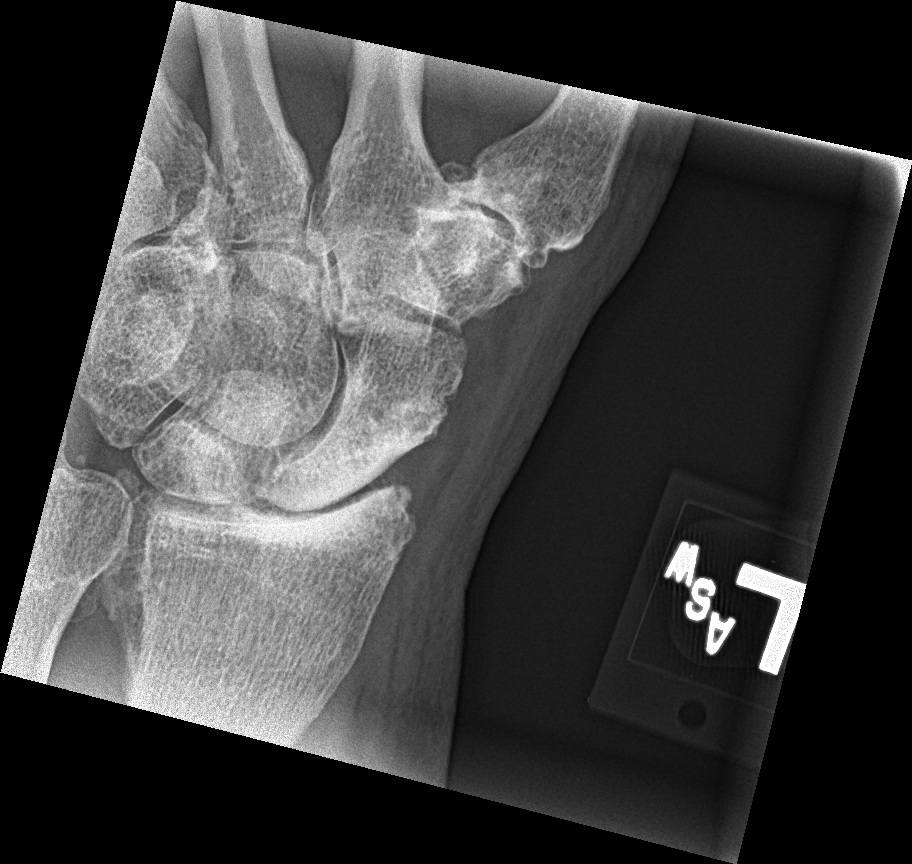

[4 of 4 positions shown; findings below may reference images not displayed]

FINDINGS: Degenerative changes of the first [facility] joint are noted. Degenerative
changes in the radiocarpal articulation are noted as well. Some well
corticated bony densities are noted about the radiocarpal joint
likely related to prior trauma. No acute fracture or dislocation is
seen.
IMPRESSION: Chronic changes without acute abnormality.

## 2017-08-16 ENCOUNTER — Encounter: Payer: Self-pay | Admitting: Cardiology

## 2017-09-24 ENCOUNTER — Ambulatory Visit (INDEPENDENT_AMBULATORY_CARE_PROVIDER_SITE_OTHER): Payer: BC Managed Care – PPO | Admitting: Internal Medicine

## 2017-09-24 ENCOUNTER — Encounter: Payer: Self-pay | Admitting: Internal Medicine

## 2017-09-24 VITALS — BP 144/88 | HR 71 | Ht 71.0 in | Wt 221.5 lb

## 2017-09-24 DIAGNOSIS — I2510 Atherosclerotic heart disease of native coronary artery without angina pectoris: Secondary | ICD-10-CM

## 2017-09-24 DIAGNOSIS — E782 Mixed hyperlipidemia: Secondary | ICD-10-CM | POA: Diagnosis not present

## 2017-09-24 DIAGNOSIS — Z950 Presence of cardiac pacemaker: Secondary | ICD-10-CM

## 2017-09-24 DIAGNOSIS — I495 Sick sinus syndrome: Secondary | ICD-10-CM

## 2017-09-24 LAB — CUP PACEART INCLINIC DEVICE CHECK
Battery Voltage: 3.01 V
Brady Statistic AP VP Percent: 0.03 %
Brady Statistic AS VP Percent: 0.01 %
Brady Statistic RA Percent Paced: 52 %
Brady Statistic RV Percent Paced: 0.04 %
Date Time Interrogation Session: 20190820152801
Implantable Lead Implant Date: 20150806
Implantable Lead Model: 5076
Implantable Pulse Generator Implant Date: 20150806
Lead Channel Impedance Value: 418 Ohm
Lead Channel Pacing Threshold Amplitude: 0.75 V
Lead Channel Pacing Threshold Pulse Width: 0.4 ms
Lead Channel Setting Sensing Sensitivity: 0.9 mV
MDC IDC LEAD IMPLANT DT: 20150806
MDC IDC LEAD LOCATION: 753859
MDC IDC LEAD LOCATION: 753860
MDC IDC MSMT BATTERY REMAINING LONGEVITY: 84 mo
MDC IDC MSMT LEADCHNL RA IMPEDANCE VALUE: 342 Ohm
MDC IDC MSMT LEADCHNL RA IMPEDANCE VALUE: 437 Ohm
MDC IDC MSMT LEADCHNL RA SENSING INTR AMPL: 4.625 mV
MDC IDC MSMT LEADCHNL RV IMPEDANCE VALUE: 513 Ohm
MDC IDC MSMT LEADCHNL RV PACING THRESHOLD AMPLITUDE: 1 V
MDC IDC MSMT LEADCHNL RV PACING THRESHOLD PULSEWIDTH: 0.4 ms
MDC IDC MSMT LEADCHNL RV SENSING INTR AMPL: 14.375 mV
MDC IDC SET LEADCHNL RA PACING AMPLITUDE: 1.5 V
MDC IDC SET LEADCHNL RV PACING AMPLITUDE: 2 V
MDC IDC SET LEADCHNL RV PACING PULSEWIDTH: 0.4 ms
MDC IDC STAT BRADY AP VS PERCENT: 52.78 %
MDC IDC STAT BRADY AS VS PERCENT: 47.17 %

## 2017-09-24 NOTE — Patient Instructions (Addendum)
Medication Instructions: - Your physician recommends that you continue on your current medications as directed. Please refer to the Current Medication list given to you today.  - check on Repatha/ Praluent (PCSK9 inhibitors) (injectable cholesterol medications)  Labwork: - none ordered  Procedures/Testing: - none ordered  Follow-Up: - Your physician recommends that you schedule a follow-up appointment in: 3 months with Emily/ Lexi in the Device Clinic for a device check.  - Your physician wants you to follow-up in: 1 year with Dr. Graciela HusbandsKlein. You will receive a reminder letter in the mail two months in advance. If you don't receive a letter, please call our office to schedule the follow-up appointment.   Any Additional Special Instructions Will Be Listed Below (If Applicable).     If you need a refill on your cardiac medications before your next appointment, please call your pharmacy.

## 2017-09-24 NOTE — Progress Notes (Signed)
ELECTROPHYSIOLOGY OFFICE NOTE  Patient ID: Eduardo PatersonRoy M Shrewsbury Jr., MRN: [id number], DOB/AGE: [birth year] 62 y.o. Admit date: (Not on file) Date of Consult: 09/24/2017  Primary Physician: Patient, No Pcp Per      HPI Eduardo PatersonRoy M Mcartor Jr. is a 62 y.o. male seen for pacemaker follow-up. This was implanted for sinus node dysfunction with syncope that occurred within weeks following IMI 2015    History of coronary artery disease with STEMI 6/15 treated with stents . DATE TEST    6/17    echo   EF 50-55 % Inferior WMA  6/17    cath  RCA 100%  Aspiration/ DES        [facility]oseptal and inferobasilar [facility]okinesis He is on DAPT   He has a history of stroke about 2005.  He has significant hyperlipidemia 6/15 LDL 207--  No repeats   The patient denies chest pain, shortness of breath, nocturnal dyspnea, orthopnea or peripheral edema.  There have been no palpitations, lightheadedness or syncope.   He races drag cars   Past Medical History:  Diagnosis Date  . Black stool   . CAD (coronary artery disease)   . Hx of ST elevation myocardial infarction   . Hyperlipidemia   . Hypertension   . Irregular heart beat   . Pacemaker-Medtronic MRI compatible   . Sinus node dysfunction (HCC)   . Stroke [facility](HCC) 2004   no residual deficits      Surgical History:  Past Surgical History:  Procedure Laterality Date  . CARDIAC CATHETERIZATION  2015   DES to RCA in setting of STEMI  . CARDIAC DEFIBRILLATOR PLACEMENT    . INSERT / REPLACE / REMOVE PACEMAKER     Medtronic     Home Meds: Prior to Admission medications   Medication Sig Start Date End Date Taking? Authorizing Provider  acetaminophen (TYLENOL) 650 MG CR tablet Take 650 mg by mouth every 8 (eight) hours as needed for pain.   Yes Historical Provider, MD  aspirin EC 81 MG tablet Take 81 mg by mouth daily.    Yes Historical Provider, MD    Allergies:  Allergies  Allergen Reactions  . Atorvastatin     Muscle pain  .  Carvedilol     Dizziness   . Rosuvastatin     Other reaction(s): Other (See Comments) Muscle pains  . Zetia [Ezetimibe]     Muscle pain     Social History   Socioeconomic History  . Marital status: Married    Spouse name: Not on file  . Number of children: Not on file  . Years of education: Not on file  . Highest education level: Not on file  Occupational History  . Not on file  Social Needs  . Financial resource strain: Not on file  . Food insecurity:    Worry: Not on file    Inability: Not on file  . Transportation needs:    Medical: Not on file    Non-medical: Not on file  Tobacco Use  . Smoking status: Former Smoker    Packs/day: 0.50    Years: 15.00    Pack years: 7.50    Types: Cigarettes    Last attempt to quit: 1990    Years since quitting: 29.6  . Smokeless tobacco: Never Used  Substance and Sexual Activity  . Alcohol use: Yes    Comment: social  . Drug use: Yes    Types: Marijuana  .  Sexual activity: Not on file  Lifestyle  . Physical activity:    Days per week: Not on file    Minutes per session: Not on file  . Stress: Not on file  Relationships  . Social connections:    Talks on phone: Not on file    Gets together: Not on file    Attends religious service: Not on file    Active member of club or organization: Not on file    Attends meetings of clubs or organizations: Not on file    Relationship status: Not on file  . Intimate partner violence:    Fear of current or ex partner: Not on file    Emotionally abused: Not on file    Physically abused: Not on file    Forced sexual activity: Not on file  Other Topics Concern  . Not on file  Social History Narrative  . Not on file     Family History  Problem Relation Age of Onset  . Hyperlipidemia Mother   . Hypertension Mother      ROS:  Please see the history of present illness.     All other systems reviewed and negative.    Physical Exam: Blood pressure (!) 144/88, pulse 71, height  5\' 11"  (1.803 m), weight 221 lb 8 oz (100.5 kg). Well developed and nourished in no acute distress HENT normal Neck supple with JVP-flat Clear Regular rate and rhythm, no murmurs or gallops Abd-soft with active BS No Clubbing cyanosis edema Skin-warm and dry A & Oriented  Grossly normal sensory and motor function     Labs: Cardiac Enzymes No results for input(s): CKTOTAL, CKMB, TROPONINI in the last 72 hours. CBC Lab Results  Component Value Date   WBC 6.1 02/21/2016   HGB 15.0 02/21/2016   HCT 43.0 02/21/2016   MCV 89 02/21/2016   PLT 334 02/21/2016   PROTIME: No results for input(s): LABPROT, INR in the last 72 hours. Chemistry No results for input(s): NA, K, CL, CO2, BUN, CREATININE, CALCIUM, PROT, BILITOT, ALKPHOS, ALT, AST, GLUCOSE in the last 168 hours.  Invalid input(s): LABALBU Lipids Lab Results  Component Value Date   CHOL 259 (H) 02/21/2016   HDL 33 (L) 02/21/2016   LDLCALC 173 (H) 02/21/2016   TRIG 267 (H) 02/21/2016   BNP No results found for: PROBNP Thyroid Function Tests: No results for input(s): TSH, T4TOTAL, T3FREE, THYROIDAB in the last 72 hours.  Invalid input(s): FREET3 Miscellaneous No results found for: DDIMER  Radiology/Studies:  No results found.  EKG: apace d @ 71 20/09/40  Assessment and Plan:   Syncope  Sinus node dysfunction  Pacemaker-Medtronic The patient's device was interrogated.  The information was reviewed. No changes were made in the programming.     Hyperlipidemia   statin intolerance  Coronary artery disease with prior stenting/prior MI   Without symptoms of ischemia  No syncope  Have reviewed again the potential benefits of PCSK9 inhibition  He will look into cost     Sherryl MangesSteven Aarav Burgett

## 2017-12-24 ENCOUNTER — Ambulatory Visit (INDEPENDENT_AMBULATORY_CARE_PROVIDER_SITE_OTHER): Payer: BC Managed Care – PPO

## 2017-12-24 ENCOUNTER — Ambulatory Visit (INDEPENDENT_AMBULATORY_CARE_PROVIDER_SITE_OTHER): Payer: BC Managed Care – PPO | Admitting: *Deleted

## 2017-12-24 DIAGNOSIS — I4891 Unspecified atrial fibrillation: Secondary | ICD-10-CM

## 2017-12-24 DIAGNOSIS — I495 Sick sinus syndrome: Secondary | ICD-10-CM

## 2017-12-24 DIAGNOSIS — Z950 Presence of cardiac pacemaker: Secondary | ICD-10-CM

## 2017-12-24 NOTE — Progress Notes (Signed)
Remote pacemaker transmission.   

## 2017-12-24 NOTE — Progress Notes (Signed)
[facility] App installed on patient's phone, monitor paired and successfully transmitted. Patient verbalizes understanding of all education and instructions. Added to remotes schedule for today. Carelink on 03/25/18 and ROV with SK/B in 09/2018.

## 2018-02-19 LAB — CUP PACEART REMOTE DEVICE CHECK
Date Time Interrogation Session: 20200115095343
Implantable Lead Implant Date: 20150806
Implantable Lead Implant Date: 20150806
Implantable Lead Location: 753859
MDC IDC LEAD LOCATION: 753860
MDC IDC PG IMPLANT DT: 20150806

## 2018-03-25 ENCOUNTER — Ambulatory Visit (INDEPENDENT_AMBULATORY_CARE_PROVIDER_SITE_OTHER): Payer: BC Managed Care – PPO

## 2018-03-25 DIAGNOSIS — I495 Sick sinus syndrome: Secondary | ICD-10-CM

## 2018-03-25 DIAGNOSIS — I4891 Unspecified atrial fibrillation: Secondary | ICD-10-CM

## 2018-03-26 ENCOUNTER — Telehealth: Payer: Self-pay

## 2018-03-26 NOTE — Telephone Encounter (Signed)
Calling to remind patient of missed remote transmission, unable to leave a message.

## 2018-03-27 LAB — CUP PACEART REMOTE DEVICE CHECK
Battery Remaining Longevity: 76 mo
Battery Voltage: 3.01 V
Brady Statistic AP VP Percent: 0.03 %
Brady Statistic AS VP Percent: 0.02 %
Brady Statistic AS VS Percent: 54.35 %
Date Time Interrogation Session: 20200219184937
Implantable Lead Implant Date: 20150806
Implantable Lead Location: 753859
Lead Channel Impedance Value: 418 Ohm
Lead Channel Impedance Value: 418 Ohm
Lead Channel Pacing Threshold Amplitude: 0.75 V
Lead Channel Pacing Threshold Pulse Width: 0.4 ms
Lead Channel Sensing Intrinsic Amplitude: 3.75 mV
Lead Channel Sensing Intrinsic Amplitude: 3.75 mV
Lead Channel Setting Pacing Amplitude: 1.5 V
Lead Channel Setting Pacing Amplitude: 2 V
MDC IDC LEAD IMPLANT DT: 20150806
MDC IDC LEAD LOCATION: 753860
MDC IDC MSMT LEADCHNL RA IMPEDANCE VALUE: 342 Ohm
MDC IDC MSMT LEADCHNL RA PACING THRESHOLD AMPLITUDE: 0.5 V
MDC IDC MSMT LEADCHNL RA PACING THRESHOLD PULSEWIDTH: 0.4 ms
MDC IDC MSMT LEADCHNL RV IMPEDANCE VALUE: 513 Ohm
MDC IDC MSMT LEADCHNL RV SENSING INTR AMPL: 11.25 mV
MDC IDC MSMT LEADCHNL RV SENSING INTR AMPL: 11.25 mV
MDC IDC PG IMPLANT DT: 20150806
MDC IDC SET LEADCHNL RV PACING PULSEWIDTH: 0.4 ms
MDC IDC SET LEADCHNL RV SENSING SENSITIVITY: 0.9 mV
MDC IDC STAT BRADY AP VS PERCENT: 45.6 %
MDC IDC STAT BRADY RA PERCENT PACED: 44.85 %
MDC IDC STAT BRADY RV PERCENT PACED: 0.05 %

## 2018-04-02 NOTE — Progress Notes (Signed)
Remote pacemaker transmission.   

## 2018-04-07 ENCOUNTER — Encounter: Payer: Self-pay | Admitting: Cardiology

## 2018-05-29 ENCOUNTER — Telehealth: Payer: Self-pay | Admitting: Internal Medicine

## 2018-05-29 NOTE — Telephone Encounter (Signed)
Virtual Visit Pre-Appointment Phone Call  "(Name), I am calling you today to discuss your upcoming appointment. We are currently trying to limit exposure to the virus that causes COVID-19 by seeing patients at home rather than in the office."  1. "What is the BEST phone number to call the day of the visit?" - include this in appointment notes  2. Do you have or have access to (through a family member/friend) a smartphone with video capability that we can use for your visit?" a. If yes - list this number in appt notes as cell (if different from BEST phone #) and list the appointment type as a VIDEO visit in appointment notes b. If no - list the appointment type as a PHONE visit in appointment notes  3. Confirm consent - "In the setting of the current Covid19 crisis, you are scheduled for a (phone or video) visit with your provider on (date) at (time).  Just as we do with many in-office visits, in order for you to participate in this visit, we must obtain consent.  If you'd like, I can send this to your mychart (if signed up) or email for you to review.  Otherwise, I can obtain your verbal consent now.  All virtual visits are billed to your insurance company just like a normal visit would be.  By agreeing to a virtual visit, we'd like you to understand that the technology does not allow for your provider to perform an examination, and thus may limit your provider's ability to fully assess your condition. If your provider identifies any concerns that need to be evaluated in person, we will make arrangements to do so.  Finally, though the technology is pretty good, we cannot assure that it will always work on either your or our end, and in the setting of a video visit, we may have to convert it to a phone-only visit.  In either situation, we cannot ensure that we have a secure connection.  Are you willing to proceed?" STAFF: Did the patient verbally acknowledge consent to telehealth visit? Document  YES/NO here: Yes   4. Advise patient to be prepared - "Two hours prior to your appointment, go ahead and check your blood pressure, pulse, oxygen saturation, and your weight (if you have the equipment to check those) and write them all down. When your visit starts, your provider will ask you for this information. If you have an Apple Watch or Kardia device, please plan to have heart rate information ready on the day of your appointment. Please have a pen and paper handy nearby the day of the visit as well."  5. Give patient instructions for MyChart download to smartphone OR Doximity/Doxy.me as below if video visit (depending on what platform provider is using)  6. Inform patient they will receive a phone call 15 minutes prior to their appointment time (may be from unknown caller ID) so they should be prepared to answer    TELEPHONE CALL NOTE  Eduardo Ray. has been deemed a candidate for a follow-up tele-health visit to limit community exposure during the Covid-19 pandemic. I spoke with the patient via phone to ensure availability of phone/video source, confirm preferred email & phone number, and discuss instructions and expectations.  I reminded Eduardo Ray. to be prepared with any vital sign and/or heart rhythm information that could potentially be obtained via home monitoring, at the time of his visit. I reminded Eduardo Ray. to expect a  phone call prior to his visit.  Eduardo Ray 05/29/2018 11:04 AM   INSTRUCTIONS FOR DOWNLOADING THE MYCHART APP TO SMARTPHONE  - The patient must first make sure to have activated MyChart and know their login information - If Apple, go to [employer] and type in MyChart in the search bar and download the app. If Android, ask patient to go to [employer] Play Store and type in TobiasMyChart in the search bar and download the app. The app is free but as with any other app downloads, their phone may require them to verify saved payment information or  Apple/Android password.  - The patient will need to then log into the app with their MyChart username and password, and select [facility] as their healthcare provider to link the account. When it is time for your visit, go to the MyChart app, find appointments, and click Begin Video Visit. Be sure to Select Allow for your device to access the Microphone and Camera for your visit. You will then be connected, and your provider will be with you shortly.  **If they have any issues connecting, or need assistance please contact MyChart service desk [phone])**  **If using a computer, in order to ensure the best quality for their visit they will need to use either of the following Internet Browsers: [employer] Edge, or Google Chrome**  IF USING DOXIMITY or DOXY.ME - The patient will receive a link just prior to their visit by text.     FULL LENGTH CONSENT FOR TELE-HEALTH VISIT   I hereby voluntarily request, consent and authorize [facility] and its employed or contracted physicians, physician assistants, nurse practitioners or other licensed health care professionals (the Practitioner), to provide me with telemedicine health care services (the Services") as deemed necessary by the treating Practitioner. I acknowledge and consent to receive the Services by the Practitioner via telemedicine. I understand that the telemedicine visit will involve communicating with the Practitioner through live audiovisual communication technology and the disclosure of certain medical information by electronic transmission. I acknowledge that I have been given the opportunity to request an in-person assessment or other available alternative prior to the telemedicine visit and am voluntarily participating in the telemedicine visit.  I understand that I have the right to withhold or withdraw my consent to the use of telemedicine in the course of my care at any time, without affecting my right to future care  or treatment, and that the Practitioner or I may terminate the telemedicine visit at any time. I understand that I have the right to inspect all information obtained and/or recorded in the course of the telemedicine visit and may receive copies of available information for a reasonable fee.  I understand that some of the potential risks of receiving the Services via telemedicine include:   Delay or interruption in medical evaluation due to technological equipment failure or disruption;  Information transmitted may not be sufficient (e.g. poor resolution of images) to allow for appropriate medical decision making by the Practitioner; and/or   In rare instances, security protocols could fail, causing a breach of personal health information.  Furthermore, I acknowledge that it is my responsibility to provide information about my medical history, conditions and care that is complete and accurate to the best of my ability. I acknowledge that Practitioner's advice, recommendations, and/or decision may be based on factors not within their control, such as incomplete or inaccurate data provided by me or distortions of diagnostic images or specimens that may result from  electronic transmissions. I understand that the practice of medicine is not an exact science and that Practitioner makes no warranties or guarantees regarding treatment outcomes. I acknowledge that I will receive a copy of this consent concurrently upon execution via email to the email address I last provided but may also request a printed copy by calling the office of [place].    I understand that my insurance will be billed for this visit.   I have read or had this consent read to me.  I understand the contents of this consent, which adequately explains the benefits and risks of the Services being provided via telemedicine.   I have been provided ample opportunity to ask questions regarding this consent and the Services and have had  my questions answered to my satisfaction.  I give my informed consent for the services to be provided through the use of telemedicine in my medical care  By participating in this telemedicine visit I agree to the above.

## 2018-06-02 ENCOUNTER — Telehealth (INDEPENDENT_AMBULATORY_CARE_PROVIDER_SITE_OTHER): Payer: BC Managed Care – PPO | Admitting: Internal Medicine

## 2018-06-02 ENCOUNTER — Other Ambulatory Visit: Payer: Self-pay

## 2018-06-02 ENCOUNTER — Encounter: Payer: Self-pay | Admitting: Internal Medicine

## 2018-06-02 VITALS — BP 135/80 | HR 60 | Ht 71.0 in | Wt 225.0 lb

## 2018-06-02 DIAGNOSIS — E782 Mixed hyperlipidemia: Secondary | ICD-10-CM

## 2018-06-02 DIAGNOSIS — I495 Sick sinus syndrome: Secondary | ICD-10-CM | POA: Diagnosis not present

## 2018-06-02 DIAGNOSIS — I2510 Atherosclerotic heart disease of native coronary artery without angina pectoris: Secondary | ICD-10-CM | POA: Diagnosis not present

## 2018-06-02 NOTE — Patient Instructions (Signed)
Medication Instructions:  - Your physician recommends that you continue on your current medications as directed. Please refer to the Current Medication list given to you today.  - please check on the cost of the 2 injectable cholesterol medications with your insurance:  - this would be for: 1) Repatha 140 mg- 1 injection every 2 weeks       Or       2) Praluent 75 mg- 1 injection every 2 weeks - different insurances prefer 1 drug vs the other, if you find out which one your plan prefers, then let us know and we will send in the prescription for you - these drugs will typically require a prior authorization which we can submit once the drug is prescribed   If you need a refill on your cardiac medications before your next appointment, please call your pharmacy.   Lab work: - none ordered  If you have labs (blood work) drawn today and your tests are completely normal, you will receive your results only by: Marland Kitchen MyChart Message (if you have MyChart) OR . A paper copy in the mail If you have any lab test that is abnormal or we need to change your treatment, we will call you to review the results.  Testing/Procedures: - none ordered  Follow-Up: At [facility], you and your health needs are our priority.  As part of our continuing mission to provide you with exceptional heart care, we have created designated Provider Care Teams.  These Care Teams include your primary Cardiologist (physician) and Advanced Practice Providers (APPs -  Physician Assistants and Nurse Practitioners) who all work together to provide you with the care you need, when you need it.  You will need a follow up appointment in 6 months.   Please call our office 2 months in advance to schedule this appointment.  (call in early August to schedule)   You may see Dr. Cristal Deer End or one of the following Advanced Practice Providers on your designated Care Team:   Nicolasa Ducking, NP Eula Listen, PA-C . Marisue Ivan,  PA-C  Any Other Special Instructions Will Be Listed Below (If Applicable). - N/A

## 2018-06-02 NOTE — Progress Notes (Signed)
Virtual Visit via Video Note   This visit type was conducted due to national recommendations for restrictions regarding the COVID-19 Pandemic (e.g. social distancing) in an effort to limit this patient's exposure and mitigate transmission in our community.  Due to his co-morbid illnesses, this patient is at least at moderate risk for complications without adequate follow up.  This format is felt to be most appropriate for this patient at this time.  All issues noted in this document were discussed and addressed.  A limited physical exam was performed with this format.  Please refer to the patient's chart for his consent to telehealth for [facility].   Evaluation Performed:  Follow-up visit  Date:  06/02/2018   ID:  Eduardo Paterson., DOB 1956/01/27, MRN [id number]  Patient Location: Home Provider Location: Home  PCP:  Patient, No Pcp Per  Cardiologist:  Yvonne Kendall, MD Electrophysiologist:  Berton Mount, MD  Chief Complaint:  Follow-up coronary artery disease  History of Present Illness:    Eduardo Tailor. is a 63 y.o. male with history of coronary artery disease status post inferior MI in 2015, sick sinus syndrome status post dual-chamber pacemaker, hyperlipidemia, and stroke.  I last saw him in 03/2016, at which time he was doing well.  Today, he remains without complaints, denying chest pain, shortness of breath, palpitations, lightheadedness, and edema.  The only medication that he is currently taking is ASA 81 mg daily.  We had previously discussed Repatha, but he was never able to found out the cost from his insurance company.  He has not been monitoring his BP regularly at home but recently obtained a cuff.  Eduardo Ray is trying to walk for an hour, 3x/week.  He does not have any limitations.  The patient does not have symptoms concerning for COVID-19 infection (fever, chills, cough, or new shortness of breath).    Past Medical History:  Diagnosis Date  . Black stool   .  CAD (coronary artery disease)   . Hx of ST elevation myocardial infarction   . Hyperlipidemia   . Hypertension   . Irregular heart beat   . Pacemaker-Medtronic MRI compatible   . Sinus node dysfunction (HCC)   . Stroke [facility]) 2004   no residual deficits   Past Surgical History:  Procedure Laterality Date  . CARDIAC CATHETERIZATION  2015   DES to RCA in setting of STEMI  . CARDIAC DEFIBRILLATOR PLACEMENT    . INSERT / REPLACE / REMOVE PACEMAKER     Medtronic     Current Meds  Medication Sig  . acetaminophen (TYLENOL) 650 MG CR tablet Take 650 mg by mouth every 8 (eight) hours as needed for pain.  Marland Kitchen aspirin EC 81 MG tablet Take 81 mg by mouth daily.      Allergies:   Atorvastatin; Carvedilol; Rosuvastatin; and Zetia [ezetimibe]   Social History   Tobacco Use  . Smoking status: Former Smoker    Packs/day: 0.50    Years: 15.00    Pack years: 7.50    Types: Cigarettes    Last attempt to quit: 1990    Years since quitting: 30.3  . Smokeless tobacco: Never Used  Substance Use Topics  . Alcohol use: Yes    Comment: social  . Drug use: Yes    Types: Marijuana     Family Hx: The patient's family history includes Hyperlipidemia in his mother; Hypertension in his mother.  ROS:   Please see the history of  present illness.   All other systems reviewed and are negative.   Prior CV studies:   The following studies were reviewed today:  Cath/PCI:  LHC/PCI (07/22/13, [facility]): LMCA normal. LAD with 20% mid vessel stenosis. LCx without significant disease. Large RCA with 100% thrombotic occlusion. 20% PDA stenosis also present. LVEDP 3 mmHg. Successful aspiration thrombectomy and OCT-guided PCI to mid RCA with placement of a Xience Alpine 4.0 x 38 mm drug-eluting stent.  EP Procedures and Devices:  Medtronic dual-chamber pacemaker (09/10/13, [facility])  Non-Invasive Evaluation(s):  TTE (03/15/16): Mildly dilated LV with mild LVH. LVEF low normal with hypokinesis of the inferior wall  (LVEF 50-55%). Mild left atrial enlargement. Normal RV size and function. No significant valvular abnormalities.  TTE (07/23/13, [facility]): Normal LV size and wall thickness with inferior and inferoseptal hypokinesis. LVEF 50-55%. RV size and function. No significant valvular abnormalities.  Labs/Other Tests and Data Reviewed:    EKG:  No ECG reviewed.  Recent Labs: No results found for requested labs within last 8760 hours.   Recent Lipid Panel Lab Results  Component Value Date/Time   CHOL 259 (H) 02/21/2016 10:50 AM   TRIG 267 (H) 02/21/2016 10:50 AM   HDL 33 (L) 02/21/2016 10:50 AM   CHOLHDL 7.8 (H) 02/21/2016 10:50 AM   LDLCALC 173 (H) 02/21/2016 10:50 AM    Wt Readings from Last 3 Encounters:  06/02/18 225 lb (102.1 kg)  09/24/17 221 lb 8 oz (100.5 kg)  05/23/16 225 lb (102.1 kg)     Objective:    Vital Signs:  BP 135/80 (BP Location: Left Arm, Patient Position: Sitting, Cuff Size: Normal)   Pulse 60   Ht 5\' 11"  (1.803 m)   Wt 225 lb (102.1 kg)   BMI 31.38 kg/m    VITAL SIGNS:  reviewed GEN:  no acute distress  ASSESSMENT & PLAN:    Coronary artery disease: Eduardo Ray continues to do well without recurrent angina.  He is exercising at least 3 days a week without any limitations.  His only medication at this time is low-dose aspirin.  We discussed lipid therapy as outlined below.  Hyperlipidemia: Eduardo Ray has been intolerant of multiple statins, citing severe myalgias with even low doses of atorvastatin and rosuvastatin in the past.  We previously discussed trial of PCSK9 inhibitor, but cost issues were concerned.  He is about to transition to Medicare and wonders if Repatha or Praluent may be more affordable for him then.  We will provide him information about these medications so that he can check with his new insurance provider.  He will then contact us if he wishes to proceed with trial of 1 of these medications.  Sinus node dysfunction: Asymptomatic, with  continued pacemaker follow-up in the device clinic.  Defer ongoing management to Dr. Graciela Husbands.  COVID-19 Education: The signs and symptoms of COVID-19 were discussed with the patient and how to seek care for testing (follow up with PCP or arrange E-visit).  The importance of social distancing was discussed today.  Time:   Today, I have spent 12 minutes with the patient with telehealth technology discussing the above problems.     Medication Adjustments/Labs and Tests Ordered: Current medicines are reviewed at length with the patient today.  Concerns regarding medicines are outlined above.   Tests Ordered: No orders of the defined types were placed in this encounter.   Medication Changes: No orders of the defined types were placed in this encounter.   Disposition:  Follow up  in 6 month(s)  Signed, Yvonne Kendallhristopher Bonny Egger, MD  06/02/2018 3:30 PM    [facility]

## 2018-06-24 ENCOUNTER — Ambulatory Visit (INDEPENDENT_AMBULATORY_CARE_PROVIDER_SITE_OTHER): Payer: Medicare Other | Admitting: *Deleted

## 2018-06-24 ENCOUNTER — Other Ambulatory Visit: Payer: Self-pay

## 2018-06-24 DIAGNOSIS — I495 Sick sinus syndrome: Secondary | ICD-10-CM

## 2018-06-25 ENCOUNTER — Telehealth: Payer: Self-pay

## 2018-06-25 LAB — CUP PACEART REMOTE DEVICE CHECK
Battery Remaining Longevity: 76 mo
Battery Voltage: 3.01 V
Brady Statistic AP VP Percent: 0.03 %
Brady Statistic AP VS Percent: 44.3 %
Brady Statistic AS VP Percent: 0.02 %
Brady Statistic AS VS Percent: 55.66 %
Brady Statistic RA Percent Paced: 43.76 %
Brady Statistic RV Percent Paced: 0.05 %
Date Time Interrogation Session: 20200520114410
Implantable Lead Implant Date: 20150806
Implantable Lead Implant Date: 20150806
Implantable Lead Location: 753859
Implantable Lead Location: 753860
Implantable Lead Model: 5076
Implantable Lead Model: 5076
Implantable Pulse Generator Implant Date: 20150806
Lead Channel Impedance Value: 342 Ohm
Lead Channel Impedance Value: 418 Ohm
Lead Channel Impedance Value: 475 Ohm
Lead Channel Impedance Value: 513 Ohm
Lead Channel Pacing Threshold Amplitude: 0.625 V
Lead Channel Pacing Threshold Amplitude: 0.75 V
Lead Channel Pacing Threshold Pulse Width: 0.4 ms
Lead Channel Pacing Threshold Pulse Width: 0.4 ms
Lead Channel Sensing Intrinsic Amplitude: 12.375 mV
Lead Channel Sensing Intrinsic Amplitude: 12.375 mV
Lead Channel Sensing Intrinsic Amplitude: 3.75 mV
Lead Channel Sensing Intrinsic Amplitude: 3.75 mV
Lead Channel Setting Pacing Amplitude: 1.5 V
Lead Channel Setting Pacing Amplitude: 2 V
Lead Channel Setting Pacing Pulse Width: 0.4 ms
Lead Channel Setting Sensing Sensitivity: 0.9 mV

## 2018-06-25 NOTE — Telephone Encounter (Signed)
Spoke with patient to remind of missed remote transmission 

## 2018-07-03 ENCOUNTER — Encounter: Payer: Self-pay | Admitting: Cardiology

## 2018-07-03 NOTE — Progress Notes (Signed)
Remote pacemaker transmission.   

## 2018-09-23 ENCOUNTER — Ambulatory Visit (INDEPENDENT_AMBULATORY_CARE_PROVIDER_SITE_OTHER): Payer: Medicare Other | Admitting: *Deleted

## 2018-09-23 DIAGNOSIS — I495 Sick sinus syndrome: Secondary | ICD-10-CM | POA: Diagnosis not present

## 2018-09-25 LAB — CUP PACEART REMOTE DEVICE CHECK
Battery Remaining Longevity: 70 mo
Battery Voltage: 3 V
Brady Statistic AP VP Percent: 0.03 %
Brady Statistic AP VS Percent: 48.58 %
Brady Statistic AS VP Percent: 0.01 %
Brady Statistic AS VS Percent: 51.38 %
Brady Statistic RA Percent Paced: 47.87 %
Brady Statistic RV Percent Paced: 0.04 %
Date Time Interrogation Session: 20200820202423
Implantable Lead Implant Date: 20150806
Implantable Lead Implant Date: 20150806
Implantable Lead Location: 753859
Implantable Lead Location: 753860
Implantable Lead Model: 5076
Implantable Lead Model: 5076
Implantable Pulse Generator Implant Date: 20150806
Lead Channel Impedance Value: 342 Ohm
Lead Channel Impedance Value: 399 Ohm
Lead Channel Impedance Value: 418 Ohm
Lead Channel Impedance Value: 513 Ohm
Lead Channel Pacing Threshold Amplitude: 0.625 V
Lead Channel Pacing Threshold Amplitude: 0.875 V
Lead Channel Pacing Threshold Pulse Width: 0.4 ms
Lead Channel Pacing Threshold Pulse Width: 0.4 ms
Lead Channel Sensing Intrinsic Amplitude: 11 mV
Lead Channel Sensing Intrinsic Amplitude: 11 mV
Lead Channel Sensing Intrinsic Amplitude: 4.125 mV
Lead Channel Sensing Intrinsic Amplitude: 4.125 mV
Lead Channel Setting Pacing Amplitude: 1.5 V
Lead Channel Setting Pacing Amplitude: 2 V
Lead Channel Setting Pacing Pulse Width: 0.4 ms
Lead Channel Setting Sensing Sensitivity: 0.9 mV

## 2018-10-01 ENCOUNTER — Encounter: Payer: Self-pay | Admitting: Cardiology

## 2018-10-01 NOTE — Progress Notes (Signed)
Remote pacemaker transmission.   

## 2018-12-23 ENCOUNTER — Ambulatory Visit (INDEPENDENT_AMBULATORY_CARE_PROVIDER_SITE_OTHER): Payer: Medicare Other | Admitting: *Deleted

## 2018-12-23 DIAGNOSIS — I495 Sick sinus syndrome: Secondary | ICD-10-CM | POA: Diagnosis not present

## 2018-12-25 ENCOUNTER — Telehealth: Payer: Self-pay

## 2018-12-25 LAB — CUP PACEART REMOTE DEVICE CHECK
Battery Remaining Longevity: 68 mo
Battery Voltage: 3 V
Brady Statistic AP VP Percent: 0.03 %
Brady Statistic AP VS Percent: 41.67 %
Brady Statistic AS VP Percent: 0.02 %
Brady Statistic AS VS Percent: 58.29 %
Brady Statistic RA Percent Paced: 41.03 %
Brady Statistic RV Percent Paced: 0.05 %
Date Time Interrogation Session: 20201119144244
Implantable Lead Implant Date: 20150806
Implantable Lead Implant Date: 20150806
Implantable Lead Location: 753859
Implantable Lead Location: 753860
Implantable Lead Model: 5076
Implantable Lead Model: 5076
Implantable Pulse Generator Implant Date: 20150806
Lead Channel Impedance Value: 342 Ohm
Lead Channel Impedance Value: 418 Ohm
Lead Channel Impedance Value: 437 Ohm
Lead Channel Impedance Value: 494 Ohm
Lead Channel Pacing Threshold Amplitude: 0.5 V
Lead Channel Pacing Threshold Amplitude: 0.875 V
Lead Channel Pacing Threshold Pulse Width: 0.4 ms
Lead Channel Pacing Threshold Pulse Width: 0.4 ms
Lead Channel Sensing Intrinsic Amplitude: 12.125 mV
Lead Channel Sensing Intrinsic Amplitude: 12.125 mV
Lead Channel Sensing Intrinsic Amplitude: 3.25 mV
Lead Channel Sensing Intrinsic Amplitude: 3.25 mV
Lead Channel Setting Pacing Amplitude: 1.5 V
Lead Channel Setting Pacing Amplitude: 2 V
Lead Channel Setting Pacing Pulse Width: 0.4 ms
Lead Channel Setting Sensing Sensitivity: 0.9 mV

## 2018-12-25 NOTE — Telephone Encounter (Signed)
Left message for patient to remind of missed remote transmission.  

## 2019-01-07 ENCOUNTER — Ambulatory Visit: Payer: Medicare Other | Admitting: Nurse Practitioner

## 2019-01-19 NOTE — Progress Notes (Signed)
Remote pacemaker transmission.   

## 2019-01-22 ENCOUNTER — Ambulatory Visit: Payer: Medicare Other | Admitting: Nurse Practitioner

## 2019-03-03 ENCOUNTER — Telehealth: Payer: Self-pay | Admitting: Internal Medicine

## 2019-03-03 NOTE — Telephone Encounter (Signed)
Patient declined to schedule after 4 calls deleting recall.

## 2019-03-24 ENCOUNTER — Ambulatory Visit (INDEPENDENT_AMBULATORY_CARE_PROVIDER_SITE_OTHER): Payer: Medicare Other | Admitting: *Deleted

## 2019-03-24 DIAGNOSIS — I495 Sick sinus syndrome: Secondary | ICD-10-CM

## 2019-03-25 LAB — CUP PACEART REMOTE DEVICE CHECK
Battery Remaining Longevity: 62 mo
Battery Voltage: 3 V
Brady Statistic AP VP Percent: 0.03 %
Brady Statistic AP VS Percent: 47.88 %
Brady Statistic AS VP Percent: 0.02 %
Brady Statistic AS VS Percent: 52.08 %
Brady Statistic RA Percent Paced: 46.96 %
Brady Statistic RV Percent Paced: 0.04 %
Date Time Interrogation Session: 20210217203536
Implantable Lead Implant Date: 20150806
Implantable Lead Implant Date: 20150806
Implantable Lead Location: 753859
Implantable Lead Location: 753860
Implantable Lead Model: 5076
Implantable Lead Model: 5076
Implantable Pulse Generator Implant Date: 20150806
Lead Channel Impedance Value: 323 Ohm
Lead Channel Impedance Value: 399 Ohm
Lead Channel Impedance Value: 418 Ohm
Lead Channel Impedance Value: 475 Ohm
Lead Channel Pacing Threshold Amplitude: 0.5 V
Lead Channel Pacing Threshold Amplitude: 1 V
Lead Channel Pacing Threshold Pulse Width: 0.4 ms
Lead Channel Pacing Threshold Pulse Width: 0.4 ms
Lead Channel Sensing Intrinsic Amplitude: 10.625 mV
Lead Channel Sensing Intrinsic Amplitude: 10.625 mV
Lead Channel Sensing Intrinsic Amplitude: 3 mV
Lead Channel Sensing Intrinsic Amplitude: 3 mV
Lead Channel Setting Pacing Amplitude: 1.5 V
Lead Channel Setting Pacing Amplitude: 2 V
Lead Channel Setting Pacing Pulse Width: 0.4 ms
Lead Channel Setting Sensing Sensitivity: 0.9 mV

## 2019-03-26 NOTE — Progress Notes (Signed)
PPM Remote  

## 2019-06-24 ENCOUNTER — Telehealth: Payer: Self-pay

## 2019-06-24 NOTE — Telephone Encounter (Signed)
Spoke with patient to remind of missed remote transmission 

## 2019-06-29 ENCOUNTER — Ambulatory Visit (INDEPENDENT_AMBULATORY_CARE_PROVIDER_SITE_OTHER): Payer: Medicare Other | Admitting: *Deleted

## 2019-06-29 DIAGNOSIS — I495 Sick sinus syndrome: Secondary | ICD-10-CM

## 2019-06-29 LAB — CUP PACEART REMOTE DEVICE CHECK
Battery Remaining Longevity: 61 mo
Battery Voltage: 2.99 V
Brady Statistic AP VP Percent: 0.03 %
Brady Statistic AP VS Percent: 41.13 %
Brady Statistic AS VP Percent: 0.02 %
Brady Statistic AS VS Percent: 58.83 %
Brady Statistic RA Percent Paced: 40.55 %
Brady Statistic RV Percent Paced: 0.04 %
Date Time Interrogation Session: 20210524064316
Implantable Lead Implant Date: 20150806
Implantable Lead Implant Date: 20150806
Implantable Lead Location: 753859
Implantable Lead Location: 753860
Implantable Lead Model: 5076
Implantable Lead Model: 5076
Implantable Pulse Generator Implant Date: 20150806
Lead Channel Impedance Value: 323 Ohm
Lead Channel Impedance Value: 399 Ohm
Lead Channel Impedance Value: 437 Ohm
Lead Channel Impedance Value: 494 Ohm
Lead Channel Pacing Threshold Amplitude: 0.625 V
Lead Channel Pacing Threshold Amplitude: 0.875 V
Lead Channel Pacing Threshold Pulse Width: 0.4 ms
Lead Channel Pacing Threshold Pulse Width: 0.4 ms
Lead Channel Sensing Intrinsic Amplitude: 11.625 mV
Lead Channel Sensing Intrinsic Amplitude: 11.625 mV
Lead Channel Sensing Intrinsic Amplitude: 3.25 mV
Lead Channel Sensing Intrinsic Amplitude: 3.25 mV
Lead Channel Setting Pacing Amplitude: 1.5 V
Lead Channel Setting Pacing Amplitude: 2 V
Lead Channel Setting Pacing Pulse Width: 0.4 ms
Lead Channel Setting Sensing Sensitivity: 0.9 mV

## 2019-06-29 NOTE — Progress Notes (Signed)
Remote pacemaker transmission.   

## 2019-09-28 ENCOUNTER — Ambulatory Visit (INDEPENDENT_AMBULATORY_CARE_PROVIDER_SITE_OTHER): Payer: Medicare Other | Admitting: *Deleted

## 2019-09-28 DIAGNOSIS — I495 Sick sinus syndrome: Secondary | ICD-10-CM | POA: Diagnosis not present

## 2019-09-30 LAB — CUP PACEART REMOTE DEVICE CHECK
Battery Remaining Longevity: 56 mo
Battery Voltage: 2.99 V
Brady Statistic AP VP Percent: 0.03 %
Brady Statistic AP VS Percent: 39.98 %
Brady Statistic AS VP Percent: 0.02 %
Brady Statistic AS VS Percent: 59.98 %
Brady Statistic RA Percent Paced: 39.32 %
Brady Statistic RV Percent Paced: 0.04 %
Date Time Interrogation Session: 20210824164304
Implantable Lead Implant Date: 20150806
Implantable Lead Implant Date: 20150806
Implantable Lead Location: 753859
Implantable Lead Location: 753860
Implantable Lead Model: 5076
Implantable Lead Model: 5076
Implantable Pulse Generator Implant Date: 20150806
Lead Channel Impedance Value: 323 Ohm
Lead Channel Impedance Value: 361 Ohm
Lead Channel Impedance Value: 418 Ohm
Lead Channel Impedance Value: 475 Ohm
Lead Channel Pacing Threshold Amplitude: 0.625 V
Lead Channel Pacing Threshold Amplitude: 0.75 V
Lead Channel Pacing Threshold Pulse Width: 0.4 ms
Lead Channel Pacing Threshold Pulse Width: 0.4 ms
Lead Channel Sensing Intrinsic Amplitude: 10.625 mV
Lead Channel Sensing Intrinsic Amplitude: 10.625 mV
Lead Channel Sensing Intrinsic Amplitude: 3.25 mV
Lead Channel Sensing Intrinsic Amplitude: 3.25 mV
Lead Channel Setting Pacing Amplitude: 1.5 V
Lead Channel Setting Pacing Amplitude: 2 V
Lead Channel Setting Pacing Pulse Width: 0.4 ms
Lead Channel Setting Sensing Sensitivity: 0.9 mV

## 2019-10-01 NOTE — Progress Notes (Signed)
Remote pacemaker transmission.   

## 2020-01-08 ENCOUNTER — Ambulatory Visit (INDEPENDENT_AMBULATORY_CARE_PROVIDER_SITE_OTHER): Payer: Medicare Other

## 2020-01-08 DIAGNOSIS — I495 Sick sinus syndrome: Secondary | ICD-10-CM | POA: Diagnosis not present

## 2020-01-08 LAB — CUP PACEART REMOTE DEVICE CHECK
Battery Remaining Longevity: 55 mo
Battery Voltage: 2.99 V
Brady Statistic AP VP Percent: 0.03 %
Brady Statistic AP VS Percent: 44.66 %
Brady Statistic AS VP Percent: 0.01 %
Brady Statistic AS VS Percent: 55.29 %
Brady Statistic RA Percent Paced: 43.88 %
Date Time Interrogation Session: 20211202193227
Implantable Lead Implant Date: 20150806
Implantable Lead Location: 753859
Implantable Lead Model: 5076
Implantable Lead Model: 5076
Implantable Pulse Generator Implant Date: 20150806
Lead Channel Impedance Value: 323 Ohm
Lead Channel Impedance Value: 380 Ohm
Lead Channel Impedance Value: 418 Ohm
Lead Channel Impedance Value: 475 Ohm
Lead Channel Pacing Threshold Pulse Width: 0.4 ms
Lead Channel Pacing Threshold Pulse Width: 0.4 ms
Lead Channel Sensing Intrinsic Amplitude: 11.875 mV
Lead Channel Sensing Intrinsic Amplitude: 11.875 mV
Lead Channel Sensing Intrinsic Amplitude: 2.875 mV
Lead Channel Sensing Intrinsic Amplitude: 2.875 mV
Lead Channel Setting Pacing Amplitude: 1.5 V
Lead Channel Setting Pacing Amplitude: 2 V
Lead Channel Setting Pacing Pulse Width: 0.4 ms
Lead Channel Setting Sensing Sensitivity: 0.9 mV

## 2020-01-10 LAB — CUP PACEART REMOTE DEVICE CHECK
Brady Statistic RV Percent Paced: 0.05 %
Implantable Lead Implant Date: 20150806
Implantable Lead Location: 753860
Lead Channel Pacing Threshold Amplitude: 0.625 V
Lead Channel Pacing Threshold Amplitude: 0.75 V

## 2020-01-19 NOTE — Progress Notes (Signed)
Remote pacemaker transmission.   

## 2020-04-08 ENCOUNTER — Ambulatory Visit (INDEPENDENT_AMBULATORY_CARE_PROVIDER_SITE_OTHER): Payer: Medicare Other

## 2020-04-08 DIAGNOSIS — I495 Sick sinus syndrome: Secondary | ICD-10-CM

## 2020-04-11 LAB — CUP PACEART REMOTE DEVICE CHECK
Battery Remaining Longevity: 47 mo
Battery Voltage: 2.98 V
Brady Statistic AP VP Percent: 0.02 %
Brady Statistic AP VS Percent: 37.68 %
Brady Statistic AS VP Percent: 0.02 %
Brady Statistic AS VS Percent: 62.28 %
Brady Statistic RA Percent Paced: 37.05 %
Brady Statistic RV Percent Paced: 0.04 %
Date Time Interrogation Session: 20220305103250
Implantable Lead Implant Date: 20150806
Implantable Lead Implant Date: 20150806
Implantable Lead Location: 753859
Implantable Lead Location: 753860
Implantable Lead Model: 5076
Implantable Lead Model: 5076
Implantable Pulse Generator Implant Date: 20150806
Lead Channel Impedance Value: 323 Ohm
Lead Channel Impedance Value: 361 Ohm
Lead Channel Impedance Value: 418 Ohm
Lead Channel Impedance Value: 475 Ohm
Lead Channel Pacing Threshold Amplitude: 0.5 V
Lead Channel Pacing Threshold Amplitude: 0.75 V
Lead Channel Pacing Threshold Pulse Width: 0.4 ms
Lead Channel Pacing Threshold Pulse Width: 0.4 ms
Lead Channel Sensing Intrinsic Amplitude: 10.875 mV
Lead Channel Sensing Intrinsic Amplitude: 10.875 mV
Lead Channel Sensing Intrinsic Amplitude: 2.75 mV
Lead Channel Sensing Intrinsic Amplitude: 2.75 mV
Lead Channel Setting Pacing Amplitude: 1.5 V
Lead Channel Setting Pacing Amplitude: 2 V
Lead Channel Setting Pacing Pulse Width: 0.4 ms
Lead Channel Setting Sensing Sensitivity: 0.9 mV

## 2020-04-19 NOTE — Progress Notes (Signed)
Remote pacemaker transmission.   

## 2020-07-08 ENCOUNTER — Ambulatory Visit (INDEPENDENT_AMBULATORY_CARE_PROVIDER_SITE_OTHER): Payer: Medicare Other

## 2020-07-08 DIAGNOSIS — I495 Sick sinus syndrome: Secondary | ICD-10-CM | POA: Diagnosis not present

## 2020-07-08 DIAGNOSIS — Z950 Presence of cardiac pacemaker: Secondary | ICD-10-CM

## 2020-07-12 LAB — CUP PACEART REMOTE DEVICE CHECK
Battery Remaining Longevity: 52 mo
Battery Voltage: 2.98 V
Brady Statistic AP VP Percent: 0.03 %
Brady Statistic AP VS Percent: 33.22 %
Brady Statistic AS VP Percent: 0.02 %
Brady Statistic AS VS Percent: 66.73 %
Brady Statistic RA Percent Paced: 32.86 %
Brady Statistic RV Percent Paced: 0.05 %
Date Time Interrogation Session: 20220606105425
Implantable Lead Implant Date: 20150806
Implantable Lead Implant Date: 20150806
Implantable Lead Location: 753859
Implantable Lead Location: 753860
Implantable Lead Model: 5076
Implantable Lead Model: 5076
Implantable Pulse Generator Implant Date: 20150806
Lead Channel Impedance Value: 323 Ohm
Lead Channel Impedance Value: 342 Ohm
Lead Channel Impedance Value: 399 Ohm
Lead Channel Impedance Value: 456 Ohm
Lead Channel Pacing Threshold Amplitude: 0.625 V
Lead Channel Pacing Threshold Amplitude: 0.875 V
Lead Channel Pacing Threshold Pulse Width: 0.4 ms
Lead Channel Pacing Threshold Pulse Width: 0.4 ms
Lead Channel Sensing Intrinsic Amplitude: 10.25 mV
Lead Channel Sensing Intrinsic Amplitude: 10.25 mV
Lead Channel Sensing Intrinsic Amplitude: 3 mV
Lead Channel Sensing Intrinsic Amplitude: 3 mV
Lead Channel Setting Pacing Amplitude: 1.5 V
Lead Channel Setting Pacing Amplitude: 2 V
Lead Channel Setting Pacing Pulse Width: 0.4 ms
Lead Channel Setting Sensing Sensitivity: 0.9 mV

## 2020-07-26 NOTE — Progress Notes (Signed)
Remote pacemaker transmission.   

## 2020-10-07 ENCOUNTER — Ambulatory Visit (INDEPENDENT_AMBULATORY_CARE_PROVIDER_SITE_OTHER): Payer: Medicare Other

## 2020-10-07 DIAGNOSIS — I495 Sick sinus syndrome: Secondary | ICD-10-CM

## 2020-10-11 LAB — CUP PACEART REMOTE DEVICE CHECK
Battery Remaining Longevity: 44 mo
Battery Voltage: 2.97 V
Brady Statistic AP VP Percent: 0.02 %
Brady Statistic AP VS Percent: 27.3 %
Brady Statistic AS VP Percent: 0.03 %
Brady Statistic AS VS Percent: 72.66 %
Brady Statistic RA Percent Paced: 27.1 %
Brady Statistic RV Percent Paced: 0.05 %
Date Time Interrogation Session: 20220902112447
Implantable Lead Implant Date: 20150806
Implantable Lead Implant Date: 20150806
Implantable Lead Location: 753859
Implantable Lead Location: 753860
Implantable Lead Model: 5076
Implantable Lead Model: 5076
Implantable Pulse Generator Implant Date: 20150806
Lead Channel Impedance Value: 342 Ohm
Lead Channel Impedance Value: 380 Ohm
Lead Channel Impedance Value: 418 Ohm
Lead Channel Impedance Value: 494 Ohm
Lead Channel Pacing Threshold Amplitude: 0.625 V
Lead Channel Pacing Threshold Amplitude: 0.875 V
Lead Channel Pacing Threshold Pulse Width: 0.4 ms
Lead Channel Pacing Threshold Pulse Width: 0.4 ms
Lead Channel Sensing Intrinsic Amplitude: 12.375 mV
Lead Channel Sensing Intrinsic Amplitude: 12.375 mV
Lead Channel Sensing Intrinsic Amplitude: 3.375 mV
Lead Channel Sensing Intrinsic Amplitude: 3.375 mV
Lead Channel Setting Pacing Amplitude: 1.5 V
Lead Channel Setting Pacing Amplitude: 2 V
Lead Channel Setting Pacing Pulse Width: 0.4 ms
Lead Channel Setting Sensing Sensitivity: 0.9 mV

## 2020-10-18 NOTE — Progress Notes (Signed)
Remote pacemaker transmission.   

## 2021-01-06 ENCOUNTER — Ambulatory Visit (INDEPENDENT_AMBULATORY_CARE_PROVIDER_SITE_OTHER): Payer: Medicare Other

## 2021-01-06 DIAGNOSIS — I495 Sick sinus syndrome: Secondary | ICD-10-CM

## 2021-01-06 LAB — CUP PACEART REMOTE DEVICE CHECK
Battery Remaining Longevity: 40 mo
Battery Voltage: 2.97 V
Brady Statistic AP VP Percent: 0.02 %
Brady Statistic AP VS Percent: 33.65 %
Brady Statistic AS VP Percent: 0.03 %
Brady Statistic AS VS Percent: 66.31 %
Brady Statistic RA Percent Paced: 33.18 %
Brady Statistic RV Percent Paced: 0.04 %
Date Time Interrogation Session: 20221202114556
Implantable Lead Implant Date: 20150806
Implantable Lead Implant Date: 20150806
Implantable Lead Location: 753859
Implantable Lead Location: 753860
Implantable Lead Model: 5076
Implantable Lead Model: 5076
Implantable Pulse Generator Implant Date: 20150806
Lead Channel Impedance Value: 323 Ohm
Lead Channel Impedance Value: 399 Ohm
Lead Channel Impedance Value: 399 Ohm
Lead Channel Impedance Value: 475 Ohm
Lead Channel Pacing Threshold Amplitude: 0.625 V
Lead Channel Pacing Threshold Amplitude: 0.875 V
Lead Channel Pacing Threshold Pulse Width: 0.4 ms
Lead Channel Pacing Threshold Pulse Width: 0.4 ms
Lead Channel Sensing Intrinsic Amplitude: 12.125 mV
Lead Channel Sensing Intrinsic Amplitude: 12.125 mV
Lead Channel Sensing Intrinsic Amplitude: 3.625 mV
Lead Channel Sensing Intrinsic Amplitude: 3.625 mV
Lead Channel Setting Pacing Amplitude: 1.5 V
Lead Channel Setting Pacing Amplitude: 2 V
Lead Channel Setting Pacing Pulse Width: 0.4 ms
Lead Channel Setting Sensing Sensitivity: 0.9 mV

## 2021-01-17 NOTE — Progress Notes (Signed)
Remote pacemaker transmission.   

## 2021-04-07 ENCOUNTER — Ambulatory Visit (INDEPENDENT_AMBULATORY_CARE_PROVIDER_SITE_OTHER): Payer: Medicare Other

## 2021-04-07 DIAGNOSIS — I495 Sick sinus syndrome: Secondary | ICD-10-CM

## 2021-04-07 LAB — CUP PACEART REMOTE DEVICE CHECK
Battery Remaining Longevity: 43 mo
Battery Voltage: 2.97 V
Brady Statistic AP VP Percent: 0.02 %
Brady Statistic AP VS Percent: 42.78 %
Brady Statistic AS VP Percent: 0.02 %
Brady Statistic AS VS Percent: 57.17 %
Brady Statistic RA Percent Paced: 42.12 %
Brady Statistic RV Percent Paced: 0.05 %
Date Time Interrogation Session: 20230303132755
Implantable Lead Implant Date: 20150806
Implantable Lead Implant Date: 20150806
Implantable Lead Location: 753859
Implantable Lead Location: 753860
Implantable Lead Model: 5076
Implantable Lead Model: 5076
Implantable Pulse Generator Implant Date: 20150806
Lead Channel Impedance Value: 323 Ohm
Lead Channel Impedance Value: 418 Ohm
Lead Channel Impedance Value: 475 Ohm
Lead Channel Impedance Value: 475 Ohm
Lead Channel Pacing Threshold Amplitude: 0.75 V
Lead Channel Pacing Threshold Amplitude: 0.875 V
Lead Channel Pacing Threshold Pulse Width: 0.4 ms
Lead Channel Pacing Threshold Pulse Width: 0.4 ms
Lead Channel Sensing Intrinsic Amplitude: 10.125 mV
Lead Channel Sensing Intrinsic Amplitude: 10.125 mV
Lead Channel Sensing Intrinsic Amplitude: 2.875 mV
Lead Channel Sensing Intrinsic Amplitude: 2.875 mV
Lead Channel Setting Pacing Amplitude: 1.5 V
Lead Channel Setting Pacing Amplitude: 2 V
Lead Channel Setting Pacing Pulse Width: 0.4 ms
Lead Channel Setting Sensing Sensitivity: 0.9 mV

## 2021-04-17 NOTE — Progress Notes (Signed)
Remote pacemaker transmission.   

## 2021-07-07 ENCOUNTER — Ambulatory Visit (INDEPENDENT_AMBULATORY_CARE_PROVIDER_SITE_OTHER): Payer: Medicare PPO

## 2021-07-07 DIAGNOSIS — I495 Sick sinus syndrome: Secondary | ICD-10-CM

## 2021-07-09 LAB — CUP PACEART REMOTE DEVICE CHECK
Battery Remaining Longevity: 39 mo
Battery Voltage: 2.96 V
Brady Statistic AP VP Percent: 0.02 %
Brady Statistic AP VS Percent: 40.48 %
Brady Statistic AS VP Percent: 0.02 %
Brady Statistic AS VS Percent: 59.48 %
Brady Statistic RA Percent Paced: 40.07 %
Brady Statistic RV Percent Paced: 0.04 %
Date Time Interrogation Session: 20230602135428
Implantable Lead Implant Date: 20150806
Implantable Lead Implant Date: 20150806
Implantable Lead Location: 753859
Implantable Lead Location: 753860
Implantable Lead Model: 5076
Implantable Lead Model: 5076
Implantable Pulse Generator Implant Date: 20150806
Lead Channel Impedance Value: 323 Ohm
Lead Channel Impedance Value: 399 Ohm
Lead Channel Impedance Value: 399 Ohm
Lead Channel Impedance Value: 456 Ohm
Lead Channel Pacing Threshold Amplitude: 0.625 V
Lead Channel Pacing Threshold Amplitude: 0.875 V
Lead Channel Pacing Threshold Pulse Width: 0.4 ms
Lead Channel Pacing Threshold Pulse Width: 0.4 ms
Lead Channel Sensing Intrinsic Amplitude: 10 mV
Lead Channel Sensing Intrinsic Amplitude: 10 mV
Lead Channel Sensing Intrinsic Amplitude: 3.125 mV
Lead Channel Sensing Intrinsic Amplitude: 3.125 mV
Lead Channel Setting Pacing Amplitude: 1.5 V
Lead Channel Setting Pacing Amplitude: 2 V
Lead Channel Setting Pacing Pulse Width: 0.4 ms
Lead Channel Setting Sensing Sensitivity: 0.9 mV

## 2021-07-13 NOTE — Progress Notes (Signed)
Remote pacemaker transmission.   

## 2021-10-06 ENCOUNTER — Ambulatory Visit (INDEPENDENT_AMBULATORY_CARE_PROVIDER_SITE_OTHER): Payer: Medicare PPO

## 2021-10-06 DIAGNOSIS — I495 Sick sinus syndrome: Secondary | ICD-10-CM | POA: Diagnosis not present

## 2021-10-10 LAB — CUP PACEART REMOTE DEVICE CHECK
Battery Remaining Longevity: 35 mo
Battery Voltage: 2.95 V
Brady Statistic AP VP Percent: 0.04 %
Brady Statistic AP VS Percent: 54.28 %
Brady Statistic AS VP Percent: 0.01 %
Brady Statistic AS VS Percent: 45.67 %
Brady Statistic RA Percent Paced: 53.66 %
Brady Statistic RV Percent Paced: 0.06 %
Date Time Interrogation Session: 20230901142526
Implantable Lead Implant Date: 20150806
Implantable Lead Implant Date: 20150806
Implantable Lead Location: 753859
Implantable Lead Location: 753860
Implantable Lead Model: 5076
Implantable Lead Model: 5076
Implantable Pulse Generator Implant Date: 20150806
Lead Channel Impedance Value: 342 Ohm
Lead Channel Impedance Value: 418 Ohm
Lead Channel Impedance Value: 475 Ohm
Lead Channel Impedance Value: 494 Ohm
Lead Channel Pacing Threshold Amplitude: 0.75 V
Lead Channel Pacing Threshold Amplitude: 0.875 V
Lead Channel Pacing Threshold Pulse Width: 0.4 ms
Lead Channel Pacing Threshold Pulse Width: 0.4 ms
Lead Channel Sensing Intrinsic Amplitude: 11 mV
Lead Channel Sensing Intrinsic Amplitude: 11 mV
Lead Channel Sensing Intrinsic Amplitude: 3.125 mV
Lead Channel Sensing Intrinsic Amplitude: 3.125 mV
Lead Channel Setting Pacing Amplitude: 1.5 V
Lead Channel Setting Pacing Amplitude: 2 V
Lead Channel Setting Pacing Pulse Width: 0.4 ms
Lead Channel Setting Sensing Sensitivity: 0.9 mV

## 2021-10-25 NOTE — Progress Notes (Signed)
Remote pacemaker transmission.   

## 2022-01-05 ENCOUNTER — Ambulatory Visit (INDEPENDENT_AMBULATORY_CARE_PROVIDER_SITE_OTHER): Payer: Medicare PPO

## 2022-01-05 DIAGNOSIS — I495 Sick sinus syndrome: Secondary | ICD-10-CM | POA: Diagnosis not present

## 2022-01-09 LAB — CUP PACEART REMOTE DEVICE CHECK
Battery Remaining Longevity: 32 mo
Battery Voltage: 2.95 V
Brady Statistic AP VP Percent: 0.03 %
Brady Statistic AP VS Percent: 52.89 %
Brady Statistic AS VP Percent: 0.02 %
Brady Statistic AS VS Percent: 47.06 %
Brady Statistic RA Percent Paced: 52.46 %
Brady Statistic RV Percent Paced: 0.05 %
Date Time Interrogation Session: 20231204183120
Implantable Lead Connection Status: 753985
Implantable Lead Connection Status: 753985
Implantable Lead Implant Date: 20150806
Implantable Lead Implant Date: 20150806
Implantable Lead Location: 753859
Implantable Lead Location: 753860
Implantable Lead Model: 5076
Implantable Lead Model: 5076
Implantable Pulse Generator Implant Date: 20150806
Lead Channel Impedance Value: 342 Ohm
Lead Channel Impedance Value: 418 Ohm
Lead Channel Impedance Value: 475 Ohm
Lead Channel Impedance Value: 494 Ohm
Lead Channel Pacing Threshold Amplitude: 0.875 V
Lead Channel Pacing Threshold Amplitude: 1 V
Lead Channel Pacing Threshold Pulse Width: 0.4 ms
Lead Channel Pacing Threshold Pulse Width: 0.4 ms
Lead Channel Sensing Intrinsic Amplitude: 11.25 mV
Lead Channel Sensing Intrinsic Amplitude: 11.25 mV
Lead Channel Sensing Intrinsic Amplitude: 3.375 mV
Lead Channel Sensing Intrinsic Amplitude: 3.375 mV
Lead Channel Setting Pacing Amplitude: 2 V
Lead Channel Setting Pacing Amplitude: 2 V
Lead Channel Setting Pacing Pulse Width: 0.4 ms
Lead Channel Setting Sensing Sensitivity: 0.9 mV
Zone Setting Status: 755011
Zone Setting Status: 755011

## 2022-01-23 NOTE — Progress Notes (Signed)
Remote pacemaker transmission.   

## 2022-04-26 ENCOUNTER — Ambulatory Visit (INDEPENDENT_AMBULATORY_CARE_PROVIDER_SITE_OTHER): Payer: Medicare PPO

## 2022-04-26 DIAGNOSIS — I495 Sick sinus syndrome: Secondary | ICD-10-CM | POA: Diagnosis not present

## 2022-04-26 LAB — CUP PACEART REMOTE DEVICE CHECK
Battery Remaining Longevity: 31 mo
Battery Voltage: 2.94 V
Brady Statistic AP VP Percent: 0.06 %
Brady Statistic AP VS Percent: 54.1 %
Brady Statistic AS VP Percent: 0.02 %
Brady Statistic AS VS Percent: 45.83 %
Brady Statistic RA Percent Paced: 53.72 %
Brady Statistic RV Percent Paced: 0.1 %
Date Time Interrogation Session: 20240321135249
Implantable Lead Connection Status: 753985
Implantable Lead Connection Status: 753985
Implantable Lead Implant Date: 20150806
Implantable Lead Implant Date: 20150806
Implantable Lead Location: 753859
Implantable Lead Location: 753860
Implantable Lead Model: 5076
Implantable Lead Model: 5076
Implantable Pulse Generator Implant Date: 20150806
Lead Channel Impedance Value: 342 Ohm
Lead Channel Impedance Value: 418 Ohm
Lead Channel Impedance Value: 456 Ohm
Lead Channel Impedance Value: 475 Ohm
Lead Channel Pacing Threshold Amplitude: 0.875 V
Lead Channel Pacing Threshold Amplitude: 1 V
Lead Channel Pacing Threshold Pulse Width: 0.4 ms
Lead Channel Pacing Threshold Pulse Width: 0.4 ms
Lead Channel Sensing Intrinsic Amplitude: 10.625 mV
Lead Channel Sensing Intrinsic Amplitude: 10.625 mV
Lead Channel Sensing Intrinsic Amplitude: 2.625 mV
Lead Channel Sensing Intrinsic Amplitude: 2.625 mV
Lead Channel Setting Pacing Amplitude: 1.75 V
Lead Channel Setting Pacing Amplitude: 2 V
Lead Channel Setting Pacing Pulse Width: 0.4 ms
Lead Channel Setting Sensing Sensitivity: 0.9 mV
Zone Setting Status: 755011
Zone Setting Status: 755011

## 2022-05-30 NOTE — Progress Notes (Signed)
Remote pacemaker transmission.   

## 2022-07-26 ENCOUNTER — Ambulatory Visit: Payer: Medicare PPO

## 2022-07-26 DIAGNOSIS — I495 Sick sinus syndrome: Secondary | ICD-10-CM | POA: Diagnosis not present

## 2022-07-30 LAB — CUP PACEART REMOTE DEVICE CHECK
Battery Remaining Longevity: 28 mo
Battery Voltage: 2.93 V
Brady Statistic AP VP Percent: 0.05 %
Brady Statistic AP VS Percent: 54.7 %
Brady Statistic AS VP Percent: 0.01 %
Brady Statistic AS VS Percent: 45.24 %
Brady Statistic RA Percent Paced: 54.38 %
Brady Statistic RV Percent Paced: 0.07 %
Date Time Interrogation Session: 20240621154440
Implantable Lead Connection Status: 753985
Implantable Lead Connection Status: 753985
Implantable Lead Implant Date: 20150806
Implantable Lead Implant Date: 20150806
Implantable Lead Location: 753859
Implantable Lead Location: 753860
Implantable Lead Model: 5076
Implantable Lead Model: 5076
Implantable Pulse Generator Implant Date: 20150806
Lead Channel Impedance Value: 323 Ohm
Lead Channel Impedance Value: 399 Ohm
Lead Channel Impedance Value: 475 Ohm
Lead Channel Impedance Value: 494 Ohm
Lead Channel Pacing Threshold Amplitude: 1 V
Lead Channel Pacing Threshold Amplitude: 1 V
Lead Channel Pacing Threshold Pulse Width: 0.4 ms
Lead Channel Pacing Threshold Pulse Width: 0.4 ms
Lead Channel Sensing Intrinsic Amplitude: 10.5 mV
Lead Channel Sensing Intrinsic Amplitude: 10.5 mV
Lead Channel Sensing Intrinsic Amplitude: 2.5 mV
Lead Channel Sensing Intrinsic Amplitude: 2.5 mV
Lead Channel Setting Pacing Amplitude: 2 V
Lead Channel Setting Pacing Amplitude: 2 V
Lead Channel Setting Pacing Pulse Width: 0.4 ms
Lead Channel Setting Sensing Sensitivity: 0.9 mV
Zone Setting Status: 755011
Zone Setting Status: 755011

## 2022-08-15 NOTE — Progress Notes (Signed)
Remote pacemaker transmission.   

## 2022-10-17 NOTE — Progress Notes (Signed)
Cardiology Office Note:  .   Date:  10/18/2022  ID:  Eduardo Ray., DOB 01/14/1956, MRN [id number] PCP: Patient, No Pcp Per  [facility] Providers Cardiologist:  None { Previously seen by Dr. Graciela Husbands from EP & Dr. Okey Dupre (prior to that - PCI & PPM @ [facility]) - last seen by Dr. Okey Dupre 05/2018  Chief Complaint  Patient presents with   New Patient (Initial Visit)    Establish care for pacemaker/CAD s/p stent placement 2015. Medications reviewed by the patient verbally.     History of Present Illness: .     Eduardo Ray. is a 67 y.o. male with a PMH notable for CAD (IMI 2015-RCA PCI at [facility]), SSS-s/p PPM, HLD, HTN and prior CVA (~2004-2005) who presents here To Reestablish Cardiology Care.  Eduardo Ray. was seen by Dr. Okey Dupre via telemedicine on 06/02/2018 as a essentially 2-year follow-up from being seen in February 2018.  Was doing well without any chest pain or dyspnea.  He was taking aspirin only.  He had previously discussed Repatha but never pursued it further-has had intolerance of atorvastatin, rosuvastatin and Zetia as well as carvedilol..  Was not paying attention to his blood pressures.  Was walking about an hour 3 days a week.  No limitations. => Apparently he was in transition to Medicare and there was consideration again a PCSK9 inhibitor.  Pacemaker.  Functioning well.  Blood pressure was 135/80 and therefore was not placed on any medications for blood pressure.    Subjective  INTERVAL HISTORY Eduardo Ray. Returns today to re-establish care. Only taking ASA.   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  With all of his activities, he really denies any active symptoms from a cardiac standpoint.  He did reiterate that in the past with his diagnosis of hyperlipidemia he had been tried on at least 4 different medicines for cholesterol (as he recalls it was rosuvastatin/Crestor, atorvastatin/Lipitor,  simvastatin/Zocor and Zetia.  As such, he is not been really treating his lipids.  He also notes that his blood pressures are usually at home in the 150 to 160 mmHg range indicating that today's pressures are pretty accurate for him.  Cardiovascular ROS: no chest pain or dyspnea on exertion negative for - edema, irregular heartbeat, orthopnea, palpitations, paroxysmal nocturnal dyspnea, rapid heart rate, shortness of breath, or syncope./near syncope; TIA/amaurosis fugax/CVA Sx, claudication.  No melena, hematochezia, hematuria, epistaxis.   ROS:  Review of Systems - Negative except - slowing down a bit - getting older.     Objective  Studies Reviewed: .       Cath/PCI: LHC/PCI (07/22/13, [facility]): LMCA normal. LAD with 20% mid vessel stenosis. LCx without significant disease. Large RCA with 100% thrombotic occlusion. 20% PDA stenosis also present. LVEDP 3 mmHg. Successful aspiration thrombectomy and OCT-guided PCI to mid RCA with placement of a Xience Alpine 4.0 x 38 mm drug-eluting stent.   EP Procedures and Devices: Medtronic dual-chamber pacemaker (09/10/13, [facility])   Non-Invasive Evaluation(s): TTE (03/15/16): Mildly dilated LV with mild LVH. LVEF low normal with hypokinesis of the inferior wall (LVEF 50-55%). Mild left atrial enlargement. Normal RV size and function. No significant valvular abnormalities. TTE (07/23/13, [facility]): Normal LV size and wall thickness with inferior and inferoseptal hypokinesis. LVEF 50-55%. RV size and function. No significant valvular abnormalities.  Risk Assessment/Calculations:     HYPERTENSION CONTROL Vitals:   10/18/22  0813 10/18/22 0819  BP: (!) 160/72 (!) 156/72    The patient's blood pressure is elevated above target today.  In order to address the patient's elevated BP: A new medication was prescribed today.; Labs and/or other diagnostics are currently pending prior to making blood pressure medication adjustments.         Physical Exam:   VS:  BP (!)  156/72 (BP Location: Right Leg, Patient Position: Sitting, Cuff Size: Large)   Pulse 70   Ht 5\' 11"  (1.803 m)   Wt 230 lb 2 oz (104.4 kg)   SpO2 98%   BMI 32.10 kg/m    Wt Readings from Last 3 Encounters:  10/18/22 230 lb 2 oz (104.4 kg)  06/02/18 225 lb (102.1 kg)  09/24/17 221 lb 8 oz (100.5 kg)    GEN: Well nourished, well developed in no acute distress; mildly obese but otherwise well-nourished and well-groomed. NECK: No JVD; No carotid bruits CARDIAC: Normal S1, S2; RRR, no murmurs, rubs, gallops RESPIRATORY:  Clear to auscultation without rales, wheezing or rhonchi ; nonlabored, good air movement. ABDOMEN: Soft, non-tender, non-distended EXTREMITIES:  No edema; No deformity      ASSESSMENT AND PLAN: .    Problem List Items Addressed This Visit       Cardiology Problems   Coronary artery disease involving native coronary artery of native heart without angina pectoris - Primary (Chronic)    Likely nonobstructive disease other than his occluded RCA at the time of his MI with no recurrent symptoms despite not being on any medications besides 81 mg aspirin.  Needs better blood location control, will use ARB-HCTZ over beta-blocker given his heart rate of 70 and history with dizziness and fatigue with beta-blocker in the past. Check lipid panel.  Based on results would likely refer for injection medications as I do not think that we can get his lipids back down without aggressive management.  Stressed importance of dietary adjustment and increased exercise.      Relevant Medications   losartan-hydrochlorothiazide (HYZAAR) 50-12.5 MG tablet   Other Relevant Orders   EKG 12-Lead   Essential hypertension (Chronic)    Blood pressure is pretty high today and he is not on a medication.  Will start losartan 50-12.5 mg daily.-Starting at 1/2 tablet daily and then increasing after 2 weeks to full tablet. Checking baseline labs.      Relevant Medications    losartan-hydrochlorothiazide (HYZAAR) 50-12.5 MG tablet   Other Relevant Orders   EKG 12-Lead   Mixed hyperlipidemia (Chronic)    Will need treatment, but with statin myopathy, will probably need to be on the injection medications.  Will check baseline labs now.  The only oral medication left to try that would have any attempt at reducing his lipid levels would be Nexletol.  This would not likely be out of control his lipids.  Will follow-up lipids in the next week or 2.  Based on results but anticipate at least starting Nexletol with plans to discuss with pharmacist that the likelihood of successfully getting the patient on PCSK9 inhibitor or inclisiran.        Relevant Medications   losartan-hydrochlorothiazide (HYZAAR) 50-12.5 MG tablet   Sinus node dysfunction (HCC) (Chronic)   Relevant Medications   losartan-hydrochlorothiazide (HYZAAR) 50-12.5 MG tablet   Other Relevant Orders   EKG 12-Lead     Other   History of pacemaker (Chronic)    He does appear to be paced today and therefore he probably is not  permanently paced.  We do have room to use beta-blockers but he has had dizziness with him in the past.  Will hold off for now.  Try to avoid the duration of time that he is paced. Will need to get reestablished with the EP.      Relevant Orders   EKG 12-Lead   History of ST elevation myocardial infarction (STEMI) (Chronic)    I am not sure how much of a full grasp he has on coronary disease and how to treat it.  Otherwise he can continue to be seen routinely.  Thankfully, his follow-up echocardiogram showed low normal EF in June 2015      Relevant Orders   EKG 12-Lead   Statin myopathy (Chronic)    Pretty much flat out refuses to use other statins.  I think he will need injection medications.  Rechecking labs shortly.  Anticipate initiation of either inclisiran or PCSK9 inhibitor .      Other Visit Diagnoses     Medication management       Relevant Orders   Lipid panel    Comprehensive metabolic panel   Hemoglobin A1c               Dispo: No follow-ups on file.  Total time spent: 18 min spent with patient + 18 min spent charting = 36 min     Signed, Marykay Lex, MD, MS Bryan Lemma, M.D., M.S. Interventional Cardiologist  [facility]  Pager # [phone] Phone # [phone] [address] [place], [place] [address]

## 2022-10-18 ENCOUNTER — Ambulatory Visit: Payer: Medicare PPO | Attending: Cardiology | Admitting: Cardiology

## 2022-10-18 ENCOUNTER — Encounter: Payer: Self-pay | Admitting: Cardiology

## 2022-10-18 VITALS — BP 156/72 | HR 70 | Ht 71.0 in | Wt 230.1 lb

## 2022-10-18 DIAGNOSIS — T466X5A Adverse effect of antihyperlipidemic and antiarteriosclerotic drugs, initial encounter: Secondary | ICD-10-CM

## 2022-10-18 DIAGNOSIS — Z950 Presence of cardiac pacemaker: Secondary | ICD-10-CM

## 2022-10-18 DIAGNOSIS — I495 Sick sinus syndrome: Secondary | ICD-10-CM

## 2022-10-18 DIAGNOSIS — Z79899 Other long term (current) drug therapy: Secondary | ICD-10-CM

## 2022-10-18 DIAGNOSIS — I2510 Atherosclerotic heart disease of native coronary artery without angina pectoris: Secondary | ICD-10-CM | POA: Diagnosis not present

## 2022-10-18 DIAGNOSIS — G720 Drug-induced myopathy: Secondary | ICD-10-CM

## 2022-10-18 DIAGNOSIS — I10 Essential (primary) hypertension: Secondary | ICD-10-CM

## 2022-10-18 DIAGNOSIS — I252 Old myocardial infarction: Secondary | ICD-10-CM | POA: Diagnosis not present

## 2022-10-18 DIAGNOSIS — E782 Mixed hyperlipidemia: Secondary | ICD-10-CM

## 2022-10-18 MED ORDER — LOSARTAN POTASSIUM-HCTZ 50-12.5 MG PO TABS: 1.0000 | ORAL_TABLET | Freq: Every day | ORAL | 3 refills | Status: DC

## 2022-10-18 NOTE — Assessment & Plan Note (Signed)
I am not sure how much of a full grasp he has on coronary disease and how to treat it.  Otherwise he can continue to be seen routinely.  Thankfully, his follow-up echocardiogram showed low normal EF in June 2015

## 2022-10-18 NOTE — Assessment & Plan Note (Signed)
Blood pressure is pretty high today and he is not on a medication.  Will start losartan 50-12.5 mg daily.-Starting at 1/2 tablet daily and then increasing after 2 weeks to full tablet. Checking baseline labs.

## 2022-10-18 NOTE — Assessment & Plan Note (Signed)
Will need treatment, but with statin myopathy, will probably need to be on the injection medications.  Will check baseline labs now.  The only oral medication left to try that would have any attempt at reducing his lipid levels would be Nexletol.  This would not likely be out of control his lipids.  Will follow-up lipids in the next week or 2.  Based on results but anticipate at least starting Nexletol with plans to discuss with pharmacist that the likelihood of successfully getting the patient on PCSK9 inhibitor or inclisiran.

## 2022-10-18 NOTE — Assessment & Plan Note (Addendum)
He does appear to be paced today and therefore he probably is not permanently paced.  We do have room to use beta-blockers but he has had dizziness with him in the past.  Will hold off for now.  Try to avoid the duration of time that he is paced. Will need to get reestablished with the EP.

## 2022-10-18 NOTE — Patient Instructions (Signed)
Medication Instructions:  Please start losartan- hydrochlorothiazide 50-12.5 daily. Please take half tablet for the first two weeks.   *If you need a refill on your cardiac medications before your next appointment, please call your pharmacy*   Lab Work: Your provider would like for you to return to have the following labs drawn: Lipid, CBC and A1C.   Please go to [facility] [address] ([facility]) [address], [place] [address] You do not need an appointment.  They are open from 7:30 am-4 pm.  Lunch from 1:00 pm- 2:00 pm You will need need to be fasting.   You may also go to any of these [facility] locations:  [employer]  - [address] [address] Chief Technology Officer)     If you have labs (blood work) drawn today and your tests are completely normal, you will receive your results only by: [employer] (if you have MyChart) OR A paper copy in the mail If you have any lab test that is abnormal or we need to change your treatment, we will call you to review the results.   Testing/Procedures: None ordered.    Follow-Up: At [facility], you and your health needs are our priority.  As part of our continuing mission to provide you with exceptional heart care, we have created designated Provider Care Teams.  These Care Teams include your primary Cardiologist (physician) and Advanced Practice Providers (APPs -  Physician Assistants and Nurse Practitioners) who all work together to provide you with the care you need, when you need it.  We recommend signing up for the patient portal called "MyChart".  Sign up information is provided on this After Visit Summary.  MyChart is used to connect with patients for Virtual Visits (Telemedicine).  Patients are able to view lab/test results, encounter notes, upcoming appointments, etc.  Non-urgent messages can be sent to your provider as well.   To learn more about what you can do with MyChart, go to  [email or web address].    Your next appointment:   5- 6 week(s)  Provider:   You may see Dr. Herbie Baltimore or one of the following Advanced Practice Providers on your designated Care Team:   Nicolasa Ducking, NP Eula Listen, PA-C Cadence Fransico Michael, [place] Charlsie Quest, NP    Other Instructions Please follow up with Dr. Graciela Husbands

## 2022-10-18 NOTE — Progress Notes (Signed)
Plan is to start losartan-HCTZ 50/12.5 mg daily at 1/2 tablet daily for 2 weeks and then increase to full tablet.  Will order labs to be checked in the next several days.

## 2022-10-18 NOTE — Assessment & Plan Note (Signed)
Likely nonobstructive disease other than his occluded RCA at the time of his MI with no recurrent symptoms despite not being on any medications besides 81 mg aspirin.  Needs better blood location control, will use ARB-HCTZ over beta-blocker given his heart rate of 70 and history with dizziness and fatigue with beta-blocker in the past. Check lipid panel.  Based on results would likely refer for injection medications as I do not think that we can get his lipids back down without aggressive management.  Stressed importance of dietary adjustment and increased exercise.

## 2022-10-18 NOTE — Assessment & Plan Note (Signed)
Pretty much flat out refuses to use other statins.  I think he will need injection medications.  Rechecking labs shortly.  Anticipate initiation of either inclisiran or PCSK9 inhibitor .

## 2022-10-25 ENCOUNTER — Ambulatory Visit (INDEPENDENT_AMBULATORY_CARE_PROVIDER_SITE_OTHER): Payer: Medicare PPO

## 2022-10-25 DIAGNOSIS — I495 Sick sinus syndrome: Secondary | ICD-10-CM

## 2022-10-25 LAB — CUP PACEART REMOTE DEVICE CHECK
Battery Remaining Longevity: 26 mo
Battery Voltage: 2.92 V
Brady Statistic AP VP Percent: 0.12 %
Brady Statistic AP VS Percent: 58.32 %
Brady Statistic AS VP Percent: 0.01 %
Brady Statistic AS VS Percent: 41.55 %
Brady Statistic RA Percent Paced: 57.94 %
Brady Statistic RV Percent Paced: 0.2 %
Date Time Interrogation Session: 20240919080525
Implantable Lead Connection Status: 753985
Implantable Lead Connection Status: 753985
Implantable Lead Implant Date: 20150806
Implantable Lead Implant Date: 20150806
Implantable Lead Location: 753859
Implantable Lead Location: 753860
Implantable Lead Model: 5076
Implantable Lead Model: 5076
Implantable Pulse Generator Implant Date: 20150806
Lead Channel Impedance Value: 342 Ohm
Lead Channel Impedance Value: 437 Ohm
Lead Channel Impedance Value: 475 Ohm
Lead Channel Impedance Value: 513 Ohm
Lead Channel Pacing Threshold Amplitude: 0.75 V
Lead Channel Pacing Threshold Amplitude: 0.875 V
Lead Channel Pacing Threshold Pulse Width: 0.4 ms
Lead Channel Pacing Threshold Pulse Width: 0.4 ms
Lead Channel Sensing Intrinsic Amplitude: 11.875 mV
Lead Channel Sensing Intrinsic Amplitude: 11.875 mV
Lead Channel Sensing Intrinsic Amplitude: 2.375 mV
Lead Channel Sensing Intrinsic Amplitude: 2.375 mV
Lead Channel Setting Pacing Amplitude: 1.5 V
Lead Channel Setting Pacing Amplitude: 2 V
Lead Channel Setting Pacing Pulse Width: 0.4 ms
Lead Channel Setting Sensing Sensitivity: 0.9 mV
Zone Setting Status: 755011
Zone Setting Status: 755011

## 2022-11-05 ENCOUNTER — Ambulatory Visit: Payer: Medicare PPO | Admitting: Cardiovascular Disease

## 2022-11-05 NOTE — Progress Notes (Signed)
Remote pacemaker transmission.   

## 2022-11-20 ENCOUNTER — Ambulatory Visit: Payer: Medicare PPO | Attending: Internal Medicine | Admitting: Internal Medicine

## 2022-11-20 ENCOUNTER — Encounter: Payer: Self-pay | Admitting: Internal Medicine

## 2022-11-20 VITALS — BP 140/90 | HR 68 | Ht 71.0 in | Wt 229.1 lb

## 2022-11-20 DIAGNOSIS — Z950 Presence of cardiac pacemaker: Secondary | ICD-10-CM

## 2022-11-20 DIAGNOSIS — I495 Sick sinus syndrome: Secondary | ICD-10-CM

## 2022-11-20 DIAGNOSIS — E782 Mixed hyperlipidemia: Secondary | ICD-10-CM

## 2022-11-20 NOTE — Progress Notes (Signed)
ELECTROPHYSIOLOGY OFFICE NOTE  Patient ID: Santanna Olenik., MRN: [id number], DOB/AGE: 02/13/1955 67 y.o. Admit date: (Not on file) Date of Consult: 11/20/2022  Primary Physician: Patient, No Pcp Per      HPI Gawain Crombie. is a 67 y.o. male seen for pacemaker Medtronic  follow-up implanted for sinus node dysfunction with syncope occurring a few weeks after IMI 2015.  He has not h been seen since 2019 by [place] although seen by general cardiology 2020 and then again 9/24.    Persistent hyperlipemia and intolerant of statin; saw Dr. Dwaine Deter 9/24 recommended bempedoic acid which he is not willing to take because of its side effect profile being similar to statins not withstanding it being a different compound.  Started on losartan HCT which he stopped due to dizziness nausea and muscle pain.       Denies chest pain or shortness of breath.  No palpitations or syncope   DATE TEST    6/17    Echo    EF 50-55 % Inferior WMA  6/17 LHC  RCA 100%  Aspiration/ DES        Date Cr K Hgb LDL  9/24 1.03 4.4   172             He races drag cars   Past Medical History:  Diagnosis Date   Black stool    CAD (coronary artery disease) 07/22/2013   Inferior STEMI-RCA 100%--aspiration thrombectomy and OCT guided PCI with Xience Alpine 4 x 38 mm DES.   Hx of ST elevation myocardial infarction 07/22/2013   100% occluded dominant RCA   Hyperlipidemia    Intolerant of atorvastatin, rosuvastatin and simvastatin as well as Zetia.   Hypertension    Has not taken any medicines   Irregular heart beat    Pacemaker-Medtronic MRI compatible 09/10/2013   Sinus node dysfunction (HCC) 09/2013   S/p PPM   Stroke (HCC) 2004   no residual deficits      Surgical History:  Past Surgical History:  Procedure Laterality Date   CARDIAC CATHETERIZATION  2015   DES to RCA in setting of STEMI   CARDIAC DEFIBRILLATOR PLACEMENT     INSERT / REPLACE / REMOVE PACEMAKER     Medtronic   TRANSTHORACIC  ECHOCARDIOGRAM  03/15/2016   [facility]): Mildly dilated LV with mild LVH.  EF normal with inferior wall HK but EF 50 to 55%.  Mild LA enlargement.  Normal RV size and function with no residual abnormalities.     Home Meds: Prior to Admission medications   Medication Sig Start Date End Date Taking? Authorizing Provider  acetaminophen (TYLENOL) 650 MG CR tablet Take 650 mg by mouth every 8 (eight) hours as needed for pain.   Yes Historical Provider, MD  aspirin EC 81 MG tablet Take 81 mg by mouth daily.    Yes Historical Provider, MD    Allergies:  Allergies  Allergen Reactions   Atorvastatin     Muscle pain   Carvedilol     Dizziness    Rosuvastatin     Other reaction(s): Other (See Comments) Muscle pains   Zetia [Ezetimibe]     Muscle pain     Social History   Socioeconomic History   Marital status: Married    Spouse name: Not on file   Number of children: Not on file   Years of education: Not on file   Highest education level: Not on file  Occupational  History   Not on file  Tobacco Use   Smoking status: Former    Current packs/day: 0.00    Average packs/day: 0.5 packs/day for 15.0 years (7.5 ttl pk-yrs)    Types: Cigarettes    Start date: 71    Quit date: 54    Years since quitting: 34.8   Smokeless tobacco: Never  Vaping Use   Vaping status: Never Used  Substance and Sexual Activity   Alcohol use: Yes    Comment: social   Drug use: Yes    Frequency: 1.0 times per week    Types: Marijuana   Sexual activity: Not on file  Other Topics Concern   Not on file  Social History Narrative   Remains quite active.  Works part-time doing Environmental consultant.   Social Determinants of Health   Financial Resource Strain: Not on file  Food Insecurity: Not on file  Transportation Needs: Not on file  Physical Activity: Not on file  Stress: Not on file  Social Connections: Not on file  Intimate Partner Violence: Not on file     Family History  Problem  Relation Age of Onset   Hyperlipidemia Mother    Hypertension Mother      ROS:  Please see the history of present illness.     All other systems reviewed and negative.    Physical Examination: BP (!) 140/90 (BP Location: Left Arm, Patient Position: Sitting, Cuff Size: Normal)   Pulse 68   Ht 5\' 11"  (1.803 m)   Wt 229 lb 2 oz (103.9 kg)   SpO2 98%   BMI 31.96 kg/m  Well developed and well nourished in no acute distress HENT normal Neck supple with JVP-flat Clear Device pocket well healed; without hematoma or erythema.  There is no tethering  Regular rate and rhythm, no  gallop No  murmur Abd-soft with active BS No Clubbing cyanosis  edema Skin-warm and dry A & Oriented  Grossly normal sensory and motor function  ECG Atrial pacing at 68 Intervals 19/10/42 PVC-rare  Device function is normal. Programming changes rate response was inactivated See Paceart for details    Assessment and Plan:   Syncope  Sinus node dysfunction  Pacemaker-Medtronic    Hyperlipidemia   statin intolerance  Coronary artery disease with prior stenting/prior MI  Hypertension  No interval syncope.  Device function is normal with reprogrammed as above.  Given his hyperlipidemia, intolerance of statins, we will refer him to [facility] for injectable therapy options.  He is open to this as he has not open to bempedoic acid.  Blood pressure today is borderline.  Remains elevated at home.  Given his intolerance of medications, we have talked about trying to manage it with the addition of potassium salts in the form of new salt and a low-sodium diet.        Sherryl Manges

## 2022-11-20 NOTE — Patient Instructions (Addendum)
Medication Instructions:  Your physician recommends that you continue on your current medications as directed. Please refer to the Current Medication list given to you today.  *If you need a refill on your cardiac medications before your next appointment, please call your pharmacy*   Lab Work: None ordered.  If you have labs (blood work) drawn today and your tests are completely normal, you will receive your results only by: MyChart Message (if you have MyChart) OR A paper copy in the mail If you have any lab test that is abnormal or we need to change your treatment, we will call you to review the results.   Testing/Procedures: None ordered.    Follow-Up: At [facility], you and your health needs are our priority.  As part of our continuing mission to provide you with exceptional heart care, we have created designated Provider Care Teams.  These Care Teams include your primary Cardiologist (physician) and Advanced Practice Providers (APPs -  Physician Assistants and Nurse Practitioners) who all work together to provide you with the care you need, when you need it.  We recommend signing up for the patient portal called "MyChart".  Sign up information is provided on this After Visit Summary.  MyChart is used to connect with patients for Virtual Visits (Telemedicine).  Patients are able to view lab/test results, encounter notes, upcoming appointments, etc.  Non-urgent messages can be sent to your provider as well.   To learn more about what you can do with MyChart, go to [email or web address].    Your next appointment:   12 months with Dr Jimmey Ralph  Other Instructions Follow low salt/Nu salt diet  Referred to PharmD for Lipid Clinic

## 2022-11-21 NOTE — Progress Notes (Unsigned)
Cardiology Office Note    Date:  11/22/2022   ID:  Eduardo Paterson., DOB 1955/10/01, MRN [id number]  PCP:  Patient, No Pcp Per  Cardiologist:  Bryan Lemma, MD  Electrophysiologist:  Sherryl Manges, MD   Chief Complaint: Follow-up  History of Present Illness:   Eduardo Drudge. is a 67 y.o. male with history of CAD with inferior MI in 2015 status post PCI/DES to the mid RCA at [facility], sick sinus syndrome status post dual-chamber PPM at [facility] in 09/2013, PSVT, CVA, and HLD with significant statin intolerance with associated myalgias and muscle weakness who presents for follow-up of CAD and HLD.  He previously followed with [facility] cardiology following admission for inferior STEMI in 2015 until relocating to [place] with subsequent relocation back to the [place] area.  With regards to his MI, he was admitted to [facility] in 07/2013 with inferior ST elevation MI.  Emergent LHC showed a thrombotic occlusion of the mid RCA treated successfully with aspiration thrombectomy and PCI/DES.  Echo at the time of his MI showed an EF of 50 to 55%, normal wall thickness, inferior and inferoseptal hypokinesis, normal RV systolic function and ventricular cavity size, and no significant valvular abnormalities.  A few weeks after leaving the hospital he had an episode of diaphoresis and weakness with ultimate syncope.  He was wearing an event monitor at the time which revealed marked bradycardia including sinus arrest with an 11-second pause.  In this setting he underwent successful dual-chamber PPM at [facility] in 09/2013.  Echo from 03/2016 showed an EF of 50 to 55%, mild LVH, mild hypokinesis of the inferior myocardium, normal LV diastolic function, mildly dilated left atrium, trileaflet aortic valve, trivial mitral regurgitation, and normal size/structure aortic root and ascending aorta.  He was lost to provider follow-up following virtual visit in 05/2018 until reestablishing care with provider in 10/2022 (device clinic monitoring every  3 months during that time span).  Upon reestablishing with our office in 10/2022, he was without symptoms of angina or cardiac decompensation.  He was active at baseline.  He was only taking aspirin.  He again reported to significant myalgias and generalized muscle weakness/statin myopathy with statin therapy including having tried rosuvastatin, atorvastatin, simvastatin, and ezetimibe.  Blood pressure was suboptimally controlled in the 150s to 160s systolic.  Also previously noted dizziness and fatigue associated beta-blocker usage.  He was started on losartan/HCTZ 50/12.5 mg half tablet daily then increasing to full tablet after 2 weeks.  With regards to his hyperlipidemia, he declined rechallenge of statins with anticipation to escalate injectable medication such as inclisiran or PCSK9 inhibitor as it is felt bempedoic acid would unlikely to achieve target LDL.  He reestablished care with the EP on 11/20/2022 and declined bempedoic acid.  He was open to lipid clinic referral.  Most recent device interrogation from 10/2022 showed 2 episodes of SVT with the longest interval lasting 2 minutes and 39 seconds with a mean heart rate of 154 bpm.  He comes in doing well from a cardiac perspective and is without symptoms of angina or cardiac decompensation.  No palpitations, dizziness, presyncope, or syncope.  No falls or symptoms concerning for bleeding.  No lower extremity swelling or progressive orthopnea.  He self discontinued losartan/HCTZ indicating this also led to myalgias, arthralgias, generalized malaise and fatigue, and nausea.  These are all symptoms he has also previously experienced with statins and ezetimibe.  He indicates he does try and research individual chemical compounds of breakdown  with pharmaceuticals and wonders if there is a common denominator with his medications leading to intolerance.  Remains agreeable to being evaluated by the lipid clinic later this month.  Does understand the importance of  pharmacotherapy along with risk stratification and secondary prevention.   Labs independently reviewed: 10/2022 - A1c 5.8, BUN 11, serum creatinine 1.03, potassium 4.4, albumin 4.5, AST/ALT normal, TC 282, TG 414, HDL 28, LDL 172 02/2016 - Hgb 15.0, PLT 334, TSH normal  Past Medical History:  Diagnosis Date   Black stool    CAD (coronary artery disease) 07/22/2013   Inferior STEMI-RCA 100%--aspiration thrombectomy and OCT guided PCI with Xience Alpine 4 x 38 mm DES.   Hx of ST elevation myocardial infarction 07/22/2013   100% occluded dominant RCA   Hyperlipidemia    Intolerant of atorvastatin, rosuvastatin and simvastatin as well as Zetia.   Hypertension    Has not taken any medicines   Irregular heart beat    Pacemaker-Medtronic MRI compatible 09/10/2013   Sinus node dysfunction (HCC) 09/2013   S/p PPM   Stroke (HCC) 2004   no residual deficits    Past Surgical History:  Procedure Laterality Date   CARDIAC CATHETERIZATION  2015   DES to RCA in setting of STEMI   CARDIAC DEFIBRILLATOR PLACEMENT     INSERT / REPLACE / REMOVE PACEMAKER     Medtronic   TRANSTHORACIC ECHOCARDIOGRAM  03/15/2016   [facility]): Mildly dilated LV with mild LVH.  EF normal with inferior wall HK but EF 50 to 55%.  Mild LA enlargement.  Normal RV size and function with no residual abnormalities.    Current Medications: Current Meds  Medication Sig   acetaminophen (TYLENOL) 650 MG CR tablet Take 650 mg by mouth every 8 (eight) hours as needed for pain.   aspirin EC 81 MG tablet Take 81 mg by mouth daily.     Allergies:   Atorvastatin, Carvedilol, Rosuvastatin, and Zetia [ezetimibe]   Social History   Socioeconomic History   Marital status: Married    Spouse name: Not on file   Number of children: Not on file   Years of education: Not on file   Highest education level: Not on file  Occupational History   Not on file  Tobacco Use   Smoking status: Former    Current packs/day: 0.00    Average  packs/day: 0.5 packs/day for 15.0 years (7.5 ttl pk-yrs)    Types: Cigarettes    Start date: 15    Quit date: 40    Years since quitting: 34.8   Smokeless tobacco: Never  Vaping Use   Vaping status: Never Used  Substance and Sexual Activity   Alcohol use: Yes    Comment: social   Drug use: Yes    Frequency: 1.0 times per week    Types: Marijuana   Sexual activity: Not on file  Other Topics Concern   Not on file  Social History Narrative   Remains quite active.  Works part-time doing Environmental consultant.   Social Determinants of Health   Financial Resource Strain: Not on file  Food Insecurity: Not on file  Transportation Needs: Not on file  Physical Activity: Not on file  Stress: Not on file  Social Connections: Not on file     Family History:  The patient's family history includes Hyperlipidemia in his mother; Hypertension in his mother.  ROS:   12-point review of systems is negative unless otherwise noted in the HPI.   EKGs/Labs/Other  Studies Reviewed:    Studies reviewed were summarized above. The additional studies were reviewed today:  2D echo 03/15/2016: - Left ventricle: The cavity size was mildly dilated. Wall    thickness was increased in a pattern of mild LVH. Systolic    function was normal. The estimated ejection fraction was in the    range of 50% to 55%. Mild hypokinesis of the inferior myocardium.    Left ventricular diastolic function parameters were normal.  - Left atrium: The atrium was mildly dilated.    EKG:  EKG is not ordered today.    Recent Labs: 10/23/2022: ALT 14; BUN 11; Creatinine, Ser 1.03; Potassium 4.4; Sodium 141  Recent Lipid Panel    Component Value Date/Time   CHOL 282 (H) 10/23/2022 0847   TRIG 414 (H) 10/23/2022 0847   HDL 28 (L) 10/23/2022 0847   CHOLHDL 10.1 (H) 10/23/2022 0847   LDLCALC 172 (H) 10/23/2022 0847    PHYSICAL EXAM:    VS:  BP (!) 148/91 (BP Location: Left Arm, Patient Position: Sitting, Cuff  Size: Large)   Pulse 81   Ht 5\' 11"  (1.803 m)   Wt 225 lb 12.8 oz (102.4 kg)   SpO2 98%   BMI 31.49 kg/m   BMI: Body mass index is 31.49 kg/m.  Physical Exam Vitals reviewed.  Constitutional:      Appearance: He is well-developed.  HENT:     Head: Normocephalic and atraumatic.  Eyes:     General:        Right eye: No discharge.        Left eye: No discharge.  Cardiovascular:     Rate and Rhythm: Normal rate and regular rhythm.     Heart sounds: Normal heart sounds, S1 normal and S2 normal. Heart sounds not distant. No midsystolic click and no opening snap. No murmur heard.    No friction rub.  Pulmonary:     Effort: Pulmonary effort is normal. No respiratory distress.     Breath sounds: Normal breath sounds. No decreased breath sounds, wheezing, rhonchi or rales.  Chest:     Chest wall: No tenderness.  Abdominal:     General: There is no distension.  Musculoskeletal:     Cervical back: Normal range of motion.  Skin:    General: Skin is warm and dry.     Nails: There is no clubbing.  Neurological:     Mental Status: He is alert and oriented to person, place, and time.  Psychiatric:        Speech: Speech normal.        Behavior: Behavior normal.        Thought Content: Thought content normal.        Judgment: Judgment normal.     Wt Readings from Last 3 Encounters:  11/22/22 225 lb 12.8 oz (102.4 kg)  11/20/22 229 lb 2 oz (103.9 kg)  10/18/22 230 lb 2 oz (104.4 kg)     ASSESSMENT & PLAN:   CAD involving the native coronary arteries without angina: He is without symptoms of angina or cardiac decompensation.  He has not required ischemic evaluation since his MI.  Multiple modifiable risk factors remain uncontrolled as outlined below in the setting of medication intolerances.  Remains on aspirin 81 mg daily.  He will be evaluated by the lipid clinic later this month to assist with lipid management.   Sick sinus syndrome: Management per EP.  PSVT: Noted on device  interrogation, longest interval lasting  2 minutes 39 seconds with a mean heart rate of 154 bpm.  Asymptomatic.  Not currently on beta-blocker in an effort to minimize duration of pacing and with prior noted intolerance to carvedilol.  HTN: Blood pressure remains suboptimally controlled.  Previously intolerant to carvedilol and most recently intolerant to losartan/HCTZ with associated myalgias, arthralgias, generalized malaise and fatigue, and nausea.  These are all symptoms he has previously experienced with statin parents as well.  He will research amlodipine and let us know if he would like to undergo a trial of therapy.  Low-sodium diet is encouraged.  HLD with statin intolerance/statin myopathy: Has previously tried atorvastatin, rosuvastatin, simvastatin, and ezetimibe, all with associated statin myopathy/generalized muscle weakness.  To be evaluated by the lipid clinic later this month.  Medication management: Somewhat limited in options in the setting of numerous medication intolerances and patient concern for intolerances moving forward with a new pharmacotherapy options.  In this setting, numerous modifiable risk factors remain uncontrolled.  He is aware of this risk.     Disposition: F/u with Dr. Herbie Baltimore or an APP in 3 months, and EP as directed.    Medication Adjustments/Labs and Tests Ordered: Current medicines are reviewed at length with the patient today.  Concerns regarding medicines are outlined above. Medication changes, Labs and Tests ordered today are summarized above and listed in the Patient Instructions accessible in Encounters.   Signed, Eduardo Listen, PA-C 11/22/2022 1:22 PM      [facility] [address] [place], [place] [address] [phone]

## 2022-11-22 ENCOUNTER — Encounter: Payer: Self-pay | Admitting: Physician Assistant

## 2022-11-22 ENCOUNTER — Ambulatory Visit: Payer: Medicare PPO | Attending: Physician Assistant | Admitting: Physician Assistant

## 2022-11-22 VITALS — BP 148/91 | HR 81 | Ht 71.0 in | Wt 225.8 lb

## 2022-11-22 DIAGNOSIS — I495 Sick sinus syndrome: Secondary | ICD-10-CM

## 2022-11-22 DIAGNOSIS — I471 Supraventricular tachycardia, unspecified: Secondary | ICD-10-CM | POA: Diagnosis not present

## 2022-11-22 DIAGNOSIS — Z950 Presence of cardiac pacemaker: Secondary | ICD-10-CM

## 2022-11-22 DIAGNOSIS — Z789 Other specified health status: Secondary | ICD-10-CM

## 2022-11-22 DIAGNOSIS — E785 Hyperlipidemia, unspecified: Secondary | ICD-10-CM

## 2022-11-22 DIAGNOSIS — I2510 Atherosclerotic heart disease of native coronary artery without angina pectoris: Secondary | ICD-10-CM | POA: Diagnosis not present

## 2022-11-22 DIAGNOSIS — T466X5D Adverse effect of antihyperlipidemic and antiarteriosclerotic drugs, subsequent encounter: Secondary | ICD-10-CM

## 2022-11-22 DIAGNOSIS — Z79899 Other long term (current) drug therapy: Secondary | ICD-10-CM

## 2022-11-22 DIAGNOSIS — G720 Drug-induced myopathy: Secondary | ICD-10-CM

## 2022-11-22 DIAGNOSIS — I10 Essential (primary) hypertension: Secondary | ICD-10-CM

## 2022-11-22 DIAGNOSIS — T466X5A Adverse effect of antihyperlipidemic and antiarteriosclerotic drugs, initial encounter: Secondary | ICD-10-CM

## 2022-11-22 NOTE — Patient Instructions (Addendum)
Look at Amlodipine  Medication Instructions:  No changes at this time.   *If you need a refill on your cardiac medications before your next appointment, please call your pharmacy*   Lab Work: None  If you have labs (blood work) drawn today and your tests are completely normal, you will receive your results only by: MyChart Message (if you have MyChart) OR A paper copy in the mail If you have any lab test that is abnormal or we need to change your treatment, we will call you to review the results.   Testing/Procedures: None   Follow-Up: At [facility] you and your health needs are our priority.  As part of our continuing mission to provide you with exceptional heart care, we have created designated Provider Care Teams.  These Care Teams include your primary Cardiologist (physician) and Advanced Practice Providers (APPs -  Physician Assistants and Nurse Practitioners) who all work together to provide you with the care you need, when you need it.   Your next appointment:   3 month(s)  Provider:   Yvonne Kendall, MD or Eula Listen, PA-C

## 2022-11-25 LAB — CUP PACEART INCLINIC DEVICE CHECK
Date Time Interrogation Session: 20241015151236
Implantable Lead Connection Status: 753985
Implantable Lead Connection Status: 753985
Implantable Lead Implant Date: 20150806
Implantable Lead Implant Date: 20150806
Implantable Lead Location: 753859
Implantable Lead Location: 753860
Implantable Lead Model: 5076
Implantable Lead Model: 5076
Implantable Pulse Generator Implant Date: 20150806
Lead Channel Impedance Value: 532 Ohm
Lead Channel Impedance Value: 551 Ohm
Lead Channel Pacing Threshold Amplitude: 0.75 V
Lead Channel Pacing Threshold Amplitude: 1 V
Lead Channel Pacing Threshold Pulse Width: 0.4 ms
Lead Channel Pacing Threshold Pulse Width: 0.4 ms
Lead Channel Sensing Intrinsic Amplitude: 12.4 mV
Lead Channel Sensing Intrinsic Amplitude: 12.4 mV
Lead Channel Sensing Intrinsic Amplitude: 2.9 mV
Lead Channel Sensing Intrinsic Amplitude: UNDETERMINED

## 2022-11-26 ENCOUNTER — Telehealth: Payer: Self-pay | Admitting: Cardiology

## 2022-11-26 NOTE — Telephone Encounter (Signed)
Gave the information provided by the Pharmacist, he verbalized understanding.

## 2022-11-26 NOTE — Telephone Encounter (Signed)
Patient wants to know what type of shot he will be getting at his Lipid clinic visit on 10/29.  Patient stated he wants to get more information about the shot.

## 2022-11-26 NOTE — Telephone Encounter (Signed)
He is not getting an injection at that appointment, the appointment is to discuss different cholesterol medication options which would include injections like either Repatha or Leqvio.

## 2022-12-04 ENCOUNTER — Ambulatory Visit: Payer: Medicare PPO

## 2023-01-31 ENCOUNTER — Ambulatory Visit (INDEPENDENT_AMBULATORY_CARE_PROVIDER_SITE_OTHER): Payer: Medicare PPO

## 2023-01-31 DIAGNOSIS — I495 Sick sinus syndrome: Secondary | ICD-10-CM | POA: Diagnosis not present

## 2023-02-01 LAB — CUP PACEART REMOTE DEVICE CHECK
Battery Remaining Longevity: 23 mo
Battery Voltage: 2.92 V
Brady Statistic AP VP Percent: 0.12 %
Brady Statistic AP VS Percent: 49.75 %
Brady Statistic AS VP Percent: 0.02 %
Brady Statistic AS VS Percent: 50.11 %
Brady Statistic RA Percent Paced: 49.62 %
Brady Statistic RV Percent Paced: 0.21 %
Date Time Interrogation Session: 20241226133434
Implantable Lead Connection Status: 753985
Implantable Lead Connection Status: 753985
Implantable Lead Implant Date: 20150806
Implantable Lead Implant Date: 20150806
Implantable Lead Location: 753859
Implantable Lead Location: 753860
Implantable Lead Model: 5076
Implantable Lead Model: 5076
Implantable Pulse Generator Implant Date: 20150806
Lead Channel Impedance Value: 323 Ohm
Lead Channel Impedance Value: 399 Ohm
Lead Channel Impedance Value: 456 Ohm
Lead Channel Impedance Value: 494 Ohm
Lead Channel Pacing Threshold Amplitude: 0.875 V
Lead Channel Pacing Threshold Amplitude: 1 V
Lead Channel Pacing Threshold Pulse Width: 0.4 ms
Lead Channel Pacing Threshold Pulse Width: 0.4 ms
Lead Channel Sensing Intrinsic Amplitude: 10.125 mV
Lead Channel Sensing Intrinsic Amplitude: 10.125 mV
Lead Channel Sensing Intrinsic Amplitude: 2.75 mV
Lead Channel Sensing Intrinsic Amplitude: 2.75 mV
Lead Channel Setting Pacing Amplitude: 2 V
Lead Channel Setting Pacing Amplitude: 2 V
Lead Channel Setting Pacing Pulse Width: 0.4 ms
Lead Channel Setting Sensing Sensitivity: 0.9 mV
Zone Setting Status: 755011
Zone Setting Status: 755011

## 2023-02-27 ENCOUNTER — Ambulatory Visit: Payer: Medicare PPO | Attending: Physician Assistant | Admitting: Physician Assistant

## 2023-02-27 NOTE — Progress Notes (Deleted)
Cardiology Office Note    Date:  02/27/2023   ID:  Eduardo Macgillivray., DOB 04/16/1955, MRN [id number]  PCP:  Patient, No Pcp Per  Cardiologist:  Bryan Lemma, MD  Electrophysiologist:  Sherryl Manges, MD   Chief Complaint: Follow-up  History of Present Illness:   Eduardo Schellinger. is a 68 y.o. male with history of CAD with inferior MI in 2015 status post PCI/DES to the mid RCA at [facility], sick sinus syndrome status post dual-chamber PPM at [facility] in 09/2013, PSVT, CVA, and HLD with significant statin intolerance with associated myalgias and muscle weakness who presents for follow-up of CAD and HLD.   He previously followed with [facility] cardiology following admission for inferior STEMI in 2015 until relocating to [place] with subsequent relocation back to the [place] area.  With regards to his MI, he was admitted to [facility] in 07/2013 with inferior ST elevation MI.  Emergent LHC showed a thrombotic occlusion of the mid RCA treated successfully with aspiration thrombectomy and PCI/DES.  Echo at the time of his MI showed an EF of 50 to 55%, normal wall thickness, inferior and inferoseptal hypokinesis, normal RV systolic function and ventricular cavity size, and no significant valvular abnormalities.  A few weeks after leaving the hospital he had an episode of diaphoresis and weakness with ultimate syncope.  He was wearing an event monitor at the time which revealed marked bradycardia including sinus arrest with an 11-second pause.  In this setting he underwent successful dual-chamber PPM at [facility] in 09/2013.  Echo from 03/2016 showed an EF of 50 to 55%, mild LVH, mild hypokinesis of the inferior myocardium, normal LV diastolic function, mildly dilated left atrium, trileaflet aortic valve, trivial mitral regurgitation, and normal size/structure aortic root and ascending aorta.  He was lost to provider follow-up following virtual visit in 05/2018 until reestablishing care with provider in 10/2022 (device clinic monitoring every  3 months during that time span).  Upon reestablishing with our office in 10/2022, he was without symptoms of angina or cardiac decompensation.  He was active at baseline.  He was only taking aspirin.  He again reported to significant myalgias and generalized muscle weakness/statin myopathy with statin therapy including having tried rosuvastatin, atorvastatin, simvastatin, and ezetimibe.  Blood pressure was suboptimally controlled in the 150s to 160s systolic.  Also previously noted dizziness and fatigue associated beta-blocker usage.  He was started on losartan/HCTZ 50/12.5 mg half tablet daily then increasing to full tablet after 2 weeks.  With regards to his hyperlipidemia, he declined rechallenge of statins with anticipation to escalate injectable medication such as inclisiran or PCSK9 inhibitor as it is felt bempedoic acid would unlikely to achieve target LDL.  He reestablished care with the EP on 11/20/2022 and declined bempedoic acid.  He was open to lipid clinic referral.    He was last seen in the office in 11/2022 and had self discontinued losartan/HCTZ indicating this medication led to myalgias, arthralgias, generalized malaise and fatigue, and nausea.  He reported researching medications to determine the breakdown of chemical compounds.  Blood pressure was elevated at 148/91.  He agreed to research amlodipine and would let us know if he wanted to undergo a trial of this medication.  He was advised to follow-up with the lipid clinic later that month as scheduled, though he canceled the appointment.  ***   Labs independently reviewed: 10/2022 - A1c 5.8, BUN 11, serum creatinine 1.03, potassium 4.4, albumin 4.5, AST/ALT normal, TC 282, TG 414,  HDL 28, LDL 172 02/2016 - Hgb 15.0, PLT 334, TSH normal  Past Medical History:  Diagnosis Date   Black stool    CAD (coronary artery disease) 07/22/2013   Inferior STEMI-RCA 100%--aspiration thrombectomy and OCT guided PCI with Xience Alpine 4 x 38 mm DES.    Hx of ST elevation myocardial infarction 07/22/2013   100% occluded dominant RCA   Hyperlipidemia    Intolerant of atorvastatin, rosuvastatin and simvastatin as well as Zetia.   Hypertension    Has not taken any medicines   Irregular heart beat    Pacemaker-Medtronic MRI compatible 09/10/2013   Sinus node dysfunction (HCC) 09/2013   S/p PPM   Stroke (HCC) 2004   no residual deficits    Past Surgical History:  Procedure Laterality Date   CARDIAC CATHETERIZATION  2015   DES to RCA in setting of STEMI   CARDIAC DEFIBRILLATOR PLACEMENT     INSERT / REPLACE / REMOVE PACEMAKER     Medtronic   TRANSTHORACIC ECHOCARDIOGRAM  03/15/2016   [facility]): Mildly dilated LV with mild LVH.  EF normal with inferior wall HK but EF 50 to 55%.  Mild LA enlargement.  Normal RV size and function with no residual abnormalities.    Current Medications: No outpatient medications have been marked as taking for the 02/27/23 encounter (Appointment) with Sondra Barges, PA-C.    Allergies:   Atorvastatin, Carvedilol, Rosuvastatin, and Zetia [ezetimibe]   Social History   Socioeconomic History   Marital status: Married    Spouse name: Not on file   Number of children: Not on file   Years of education: Not on file   Highest education level: Not on file  Occupational History   Not on file  Tobacco Use   Smoking status: Former    Current packs/day: 0.00    Average packs/day: 0.5 packs/day for 15.0 years (7.5 ttl pk-yrs)    Types: Cigarettes    Start date: 61    Quit date: 71    Years since quitting: 35.0   Smokeless tobacco: Never  Vaping Use   Vaping status: Never Used  Substance and Sexual Activity   Alcohol use: Yes    Comment: social   Drug use: Yes    Frequency: 1.0 times per week    Types: Marijuana   Sexual activity: Not on file  Other Topics Concern   Not on file  Social History Narrative   Remains quite active.  Works part-time doing Environmental consultant.   Social Drivers of  Corporate investment banker Strain: Not on file  Food Insecurity: Not on file  Transportation Needs: Not on file  Physical Activity: Not on file  Stress: Not on file  Social Connections: Not on file     Family History:  The patient's family history includes Hyperlipidemia in his mother; Hypertension in his mother.  ROS:   ROS   EKGs/Labs/Other Studies Reviewed:    Studies reviewed were summarized above. The additional studies were reviewed today: ***  EKG:  EKG is ordered today.  The EKG ordered today demonstrates ***  Recent Labs: 10/23/2022: ALT 14; BUN 11; Creatinine, Ser 1.03; Potassium 4.4; Sodium 141  Recent Lipid Panel    Component Value Date/Time   CHOL 282 (H) 10/23/2022 0847   TRIG 414 (H) 10/23/2022 0847   HDL 28 (L) 10/23/2022 0847   CHOLHDL 10.1 (H) 10/23/2022 0847   LDLCALC 172 (H) 10/23/2022 0847    PHYSICAL EXAM:    VS:  There were no vitals taken for this visit.  BMI: There is no height or weight on file to calculate BMI.  Physical Exam  Wt Readings from Last 3 Encounters:  11/22/22 225 lb 12.8 oz (102.4 kg)  11/20/22 229 lb 2 oz (103.9 kg)  10/18/22 230 lb 2 oz (104.4 kg)     ASSESSMENT & PLAN:   CAD involving the native coronary arteries without angina:  Sick sinus syndrome:  PSVT:  HTN: Blood pressure  Healthy with statin intolerance:  Medication management:   {Are you ordering a CV Procedure (e.g. stress test, cath, DCCV, TEE, etc)?   Press F2        :[id number]}     Disposition: F/u with Dr. Herbie Baltimore or an APP in ***, and EP as directed.    Medication Adjustments/Labs and Tests Ordered: Current medicines are reviewed at length with the patient today.  Concerns regarding medicines are outlined above. Medication changes, Labs and Tests ordered today are summarized above and listed in the Patient Instructions accessible in Encounters.   Signed, Eula Listen, PA-C 02/27/2023 7:06 AM     [facility] [address] [place], [place] [address] [phone]

## 2023-03-06 ENCOUNTER — Encounter: Payer: Self-pay | Admitting: Internal Medicine

## 2023-05-02 ENCOUNTER — Ambulatory Visit (INDEPENDENT_AMBULATORY_CARE_PROVIDER_SITE_OTHER): Payer: Medicare PPO

## 2023-05-02 DIAGNOSIS — I495 Sick sinus syndrome: Secondary | ICD-10-CM | POA: Diagnosis not present

## 2023-05-06 LAB — CUP PACEART REMOTE DEVICE CHECK
Battery Remaining Longevity: 22 mo
Battery Voltage: 2.9 V
Brady Statistic AP VP Percent: 0.12 %
Brady Statistic AP VS Percent: 44.47 %
Brady Statistic AS VP Percent: 0.02 %
Brady Statistic AS VS Percent: 55.4 %
Brady Statistic RA Percent Paced: 44.47 %
Brady Statistic RV Percent Paced: 0.2 %
Date Time Interrogation Session: 20250328120223
Implantable Lead Connection Status: 753985
Implantable Lead Connection Status: 753985
Implantable Lead Implant Date: 20150806
Implantable Lead Implant Date: 20150806
Implantable Lead Location: 753859
Implantable Lead Location: 753860
Implantable Lead Model: 5076
Implantable Lead Model: 5076
Implantable Pulse Generator Implant Date: 20150806
Lead Channel Impedance Value: 323 Ohm
Lead Channel Impedance Value: 418 Ohm
Lead Channel Impedance Value: 456 Ohm
Lead Channel Impedance Value: 494 Ohm
Lead Channel Pacing Threshold Amplitude: 1 V
Lead Channel Pacing Threshold Amplitude: 1 V
Lead Channel Pacing Threshold Pulse Width: 0.4 ms
Lead Channel Pacing Threshold Pulse Width: 0.4 ms
Lead Channel Sensing Intrinsic Amplitude: 10.75 mV
Lead Channel Sensing Intrinsic Amplitude: 10.75 mV
Lead Channel Sensing Intrinsic Amplitude: 3 mV
Lead Channel Sensing Intrinsic Amplitude: 3 mV
Lead Channel Setting Pacing Amplitude: 2 V
Lead Channel Setting Pacing Amplitude: 2 V
Lead Channel Setting Pacing Pulse Width: 0.4 ms
Lead Channel Setting Sensing Sensitivity: 0.9 mV
Zone Setting Status: 755011
Zone Setting Status: 755011

## 2023-05-18 ENCOUNTER — Encounter: Payer: Self-pay | Admitting: Internal Medicine

## 2023-06-12 NOTE — Addendum Note (Signed)
 Addended by: Lott Rouleau A on: 06/12/2023 11:42 AM   Modules accepted: Orders

## 2023-06-12 NOTE — Progress Notes (Signed)
 Remote pacemaker transmission.

## 2023-08-01 ENCOUNTER — Ambulatory Visit: Payer: Medicare PPO

## 2023-08-01 DIAGNOSIS — I495 Sick sinus syndrome: Secondary | ICD-10-CM

## 2023-08-02 LAB — CUP PACEART REMOTE DEVICE CHECK
Battery Remaining Longevity: 16 mo
Battery Voltage: 2.9 V
Brady Statistic AP VP Percent: 0.1 %
Brady Statistic AP VS Percent: 47.75 %
Brady Statistic AS VP Percent: 0.01 %
Brady Statistic AS VS Percent: 52.14 %
Brady Statistic RA Percent Paced: 47.71 %
Brady Statistic RV Percent Paced: 0.16 %
Date Time Interrogation Session: 20250626184841
Implantable Lead Connection Status: 753985
Implantable Lead Connection Status: 753985
Implantable Lead Implant Date: 20150806
Implantable Lead Implant Date: 20150806
Implantable Lead Location: 753859
Implantable Lead Location: 753860
Implantable Lead Model: 5076
Implantable Lead Model: 5076
Implantable Pulse Generator Implant Date: 20150806
Lead Channel Impedance Value: 323 Ohm
Lead Channel Impedance Value: 399 Ohm
Lead Channel Impedance Value: 456 Ohm
Lead Channel Impedance Value: 475 Ohm
Lead Channel Pacing Threshold Amplitude: 0.875 V
Lead Channel Pacing Threshold Amplitude: 1 V
Lead Channel Pacing Threshold Pulse Width: 0.4 ms
Lead Channel Pacing Threshold Pulse Width: 0.4 ms
Lead Channel Sensing Intrinsic Amplitude: 2.875 mV
Lead Channel Sensing Intrinsic Amplitude: 2.875 mV
Lead Channel Sensing Intrinsic Amplitude: 9.875 mV
Lead Channel Sensing Intrinsic Amplitude: 9.875 mV
Lead Channel Setting Pacing Amplitude: 1.75 V
Lead Channel Setting Pacing Amplitude: 2 V
Lead Channel Setting Pacing Pulse Width: 0.4 ms
Lead Channel Setting Sensing Sensitivity: 0.9 mV
Zone Setting Status: 755011
Zone Setting Status: 755011

## 2023-10-31 ENCOUNTER — Ambulatory Visit: Payer: Medicare PPO

## 2023-10-31 DIAGNOSIS — I495 Sick sinus syndrome: Secondary | ICD-10-CM | POA: Diagnosis not present

## 2023-10-31 LAB — CUP PACEART REMOTE DEVICE CHECK
Battery Remaining Longevity: 14 mo
Battery Voltage: 2.89 V
Brady Statistic AP VP Percent: 0.05 %
Brady Statistic AP VS Percent: 51.8 %
Brady Statistic AS VP Percent: 0.01 %
Brady Statistic AS VS Percent: 48.15 %
Brady Statistic RA Percent Paced: 51.78 %
Brady Statistic RV Percent Paced: 0.06 %
Date Time Interrogation Session: 20250925122734
Implantable Lead Connection Status: 753985
Implantable Lead Connection Status: 753985
Implantable Lead Implant Date: 20150806
Implantable Lead Implant Date: 20150806
Implantable Lead Location: 753859
Implantable Lead Location: 753860
Implantable Lead Model: 5076
Implantable Lead Model: 5076
Implantable Pulse Generator Implant Date: 20150806
Lead Channel Impedance Value: 323 Ohm
Lead Channel Impedance Value: 399 Ohm
Lead Channel Impedance Value: 437 Ohm
Lead Channel Impedance Value: 475 Ohm
Lead Channel Pacing Threshold Amplitude: 0.875 V
Lead Channel Pacing Threshold Amplitude: 1 V
Lead Channel Pacing Threshold Pulse Width: 0.4 ms
Lead Channel Pacing Threshold Pulse Width: 0.4 ms
Lead Channel Sensing Intrinsic Amplitude: 2.875 mV
Lead Channel Sensing Intrinsic Amplitude: 2.875 mV
Lead Channel Sensing Intrinsic Amplitude: 9.875 mV
Lead Channel Sensing Intrinsic Amplitude: 9.875 mV
Lead Channel Setting Pacing Amplitude: 1.75 V
Lead Channel Setting Pacing Amplitude: 2 V
Lead Channel Setting Pacing Pulse Width: 0.4 ms
Lead Channel Setting Sensing Sensitivity: 0.9 mV
Zone Setting Status: 755011
Zone Setting Status: 755011

## 2023-11-02 ENCOUNTER — Ambulatory Visit: Payer: Self-pay | Admitting: Cardiology

## 2023-11-04 NOTE — Progress Notes (Signed)
 Remote PPM Transmission

## 2023-11-04 NOTE — Addendum Note (Signed)
 Addended by: VICCI SELLER A on: 11/04/2023 01:45 PM   Modules accepted: Orders

## 2023-11-30 ENCOUNTER — Ambulatory Visit: Payer: Self-pay | Admitting: Cardiology

## 2024-02-03 ENCOUNTER — Encounter

## 2024-02-03 ENCOUNTER — Ambulatory Visit: Attending: Cardiology

## 2024-02-03 DIAGNOSIS — I495 Sick sinus syndrome: Secondary | ICD-10-CM | POA: Diagnosis not present

## 2024-02-07 LAB — CUP PACEART REMOTE DEVICE CHECK
Battery Remaining Longevity: 10 mo
Battery Voltage: 2.88 V
Brady Statistic AP VP Percent: 0.04 %
Brady Statistic AP VS Percent: 48.29 %
Brady Statistic AS VP Percent: 0.01 %
Brady Statistic AS VS Percent: 51.66 %
Brady Statistic RA Percent Paced: 48.28 %
Brady Statistic RV Percent Paced: 0.06 %
Date Time Interrogation Session: 20251231182148
Implantable Lead Connection Status: 753985
Implantable Lead Connection Status: 753985
Implantable Lead Implant Date: 20150806
Implantable Lead Implant Date: 20150806
Implantable Lead Location: 753859
Implantable Lead Location: 753860
Implantable Lead Model: 5076
Implantable Lead Model: 5076
Implantable Pulse Generator Implant Date: 20150806
Lead Channel Impedance Value: 323 Ohm
Lead Channel Impedance Value: 399 Ohm
Lead Channel Impedance Value: 456 Ohm
Lead Channel Impedance Value: 475 Ohm
Lead Channel Pacing Threshold Amplitude: 0.875 V
Lead Channel Pacing Threshold Amplitude: 1 V
Lead Channel Pacing Threshold Pulse Width: 0.4 ms
Lead Channel Pacing Threshold Pulse Width: 0.4 ms
Lead Channel Sensing Intrinsic Amplitude: 10.375 mV
Lead Channel Sensing Intrinsic Amplitude: 10.375 mV
Lead Channel Sensing Intrinsic Amplitude: 2.75 mV
Lead Channel Sensing Intrinsic Amplitude: 2.75 mV
Lead Channel Setting Pacing Amplitude: 1.75 V
Lead Channel Setting Pacing Amplitude: 2 V
Lead Channel Setting Pacing Pulse Width: 0.4 ms
Lead Channel Setting Sensing Sensitivity: 0.9 mV
Zone Setting Status: 755011
Zone Setting Status: 755011

## 2024-02-09 ENCOUNTER — Ambulatory Visit: Payer: Self-pay | Admitting: Cardiology

## 2024-02-10 NOTE — Progress Notes (Signed)
 Remote PPM Transmission

## 2024-05-01 ENCOUNTER — Ambulatory Visit

## 2024-05-04 ENCOUNTER — Encounter

## 2024-05-04 ENCOUNTER — Ambulatory Visit

## 2024-08-03 ENCOUNTER — Encounter

## 2024-08-03 ENCOUNTER — Ambulatory Visit

## 2024-11-02 ENCOUNTER — Encounter

## 2024-11-02 ENCOUNTER — Ambulatory Visit

## 2025-02-01 ENCOUNTER — Encounter
